# Patient Record
Sex: Male | Born: 1965 | Race: Black or African American | Hispanic: No | Smoking: Never smoker
Health system: Southern US, Community
[De-identification: ages and names within clinical notes are randomized; demographics above are authoritative.]

## PROBLEM LIST (undated history)

## (undated) DIAGNOSIS — K219 Gastro-esophageal reflux disease without esophagitis: Secondary | ICD-10-CM

## (undated) DIAGNOSIS — M549 Dorsalgia, unspecified: Secondary | ICD-10-CM

---

## 2013-03-21 ENCOUNTER — Encounter (HOSPITAL_COMMUNITY): Payer: Self-pay | Admitting: Emergency Medicine

## 2013-03-21 ENCOUNTER — Emergency Department (HOSPITAL_COMMUNITY)
Admission: EM | Admit: 2013-03-21 | Discharge: 2013-03-21 | Disposition: A | Discharge status: Home / Self Care | Payer: Managed Care, Other (non HMO) | Source: Home / Self Care

## 2013-03-21 DIAGNOSIS — S335XXA Sprain of ligaments of lumbar spine, initial encounter: Secondary | ICD-10-CM

## 2013-03-21 DIAGNOSIS — IMO0002 Reserved for concepts with insufficient information to code with codable children: Secondary | ICD-10-CM

## 2013-03-21 DIAGNOSIS — S39012A Strain of muscle, fascia and tendon of lower back, initial encounter: Secondary | ICD-10-CM

## 2013-03-21 DIAGNOSIS — M5410 Radiculopathy, site unspecified: Secondary | ICD-10-CM

## 2013-03-21 HISTORY — DX: Dorsalgia, unspecified: M54.9

## 2013-03-21 MED ORDER — CYCLOBENZAPRINE HCL 5 MG PO TABS: 5.0000 mg | ORAL_TABLET | Freq: Three times a day (TID) | ORAL | Status: DC | PRN

## 2013-03-21 MED ORDER — DICLOFENAC POTASSIUM 50 MG PO TABS: 50.0000 mg | ORAL_TABLET | Freq: Three times a day (TID) | ORAL | Status: DC

## 2013-03-21 MED ORDER — TRAMADOL HCL 50 MG PO TABS: 50.0000 mg | ORAL_TABLET | Freq: Four times a day (QID) | ORAL | Status: DC | PRN

## 2013-03-21 MED ORDER — KETOROLAC TROMETHAMINE 60 MG/2ML IM SOLN
60.0000 mg | Freq: Once | INTRAMUSCULAR | Status: AC
  Administered 2013-03-21: 60 mg via INTRAMUSCULAR

## 2013-03-21 MED ORDER — METHYLPREDNISOLONE 4 MG PO KIT: PACK | ORAL | Status: DC

## 2013-03-21 MED ORDER — KETOROLAC TROMETHAMINE 60 MG/2ML IM SOLN
INTRAMUSCULAR | Status: AC
  Filled 2013-03-21: qty 2

## 2013-03-21 NOTE — ED Notes (Signed)
Delay in giving

## 2013-03-21 NOTE — ED Notes (Signed)
Delay in executing orders ,  Secondary to acuity of nurse assignment

## 2013-03-21 NOTE — ED Notes (Signed)
Patient chart occupied-unable to remove from chart rack

## 2013-03-21 NOTE — ED Provider Notes (Signed)
Medical screening examination/treatment/procedure(s) were performed by non-physician practitioner and as supervising physician I was immediately available for consultation/collaboration.   MORENO-COLL,Jeanita Carneiro; MD  Dasani Crear Moreno-Coll, MD 03/21/13 1546 

## 2013-03-21 NOTE — ED Provider Notes (Signed)
History     CSN: [id number]  Arrival date & time 03/21/13  1040   None     Chief Complaint  Patient presents with  . Back Pain    (Consider location/radiation/quality/duration/timing/severity/associated sxs/prior treatment) HPI Comments: 47 year old man states that he has chronic intermittent back pain approximately once per year. Couple days ago he was lifting a dresser carrying it up stairs as well as getting in the car. As these actions pretty sudden severe pain in the right low back. He complains of persistent right low back pain with some radiation to the right buttock. Pain is greatly exacerbated with movement turning twisting flexion and extension at the waist. Denies focal paresthesias, or weakness or tingling.   Past Medical History  Diagnosis Date  . Back pain     History reviewed. No pertinent past surgical history.  No family history on file.  History  Substance Use Topics  . Smoking status: Never Smoker   . Smokeless tobacco: Not on file  . Alcohol Use: No      Review of Systems  Constitutional: Negative.   Respiratory: Negative.   Gastrointestinal: Negative.   Genitourinary: Negative.   Musculoskeletal:       As per HPI  Skin: Negative.   Neurological: Negative for dizziness, weakness, numbness and headaches.  Psychiatric/Behavioral: Negative.     Allergies  Review of patient's allergies indicates not on file.  Home Medications   Current Outpatient Rx  Name  Route  Sig  Dispense  Refill  . acetaminophen (TYLENOL) 325 MG tablet   Oral   Take 650 mg by mouth every 6 (six) hours as needed for pain.         Marland Kitchen ibuprofen (ADVIL,MOTRIN) 200 MG tablet   Oral   Take 200 mg by mouth every 6 (six) hours as needed for pain.         . cyclobenzaprine (FLEXERIL) 5 MG tablet   Oral   Take 1 tablet (5 mg total) by mouth 3 (three) times daily as needed for muscle spasms.   20 tablet   0   . methylPREDNISolone (MEDROL DOSEPAK) 4 MG tablet     follow package directions   21 tablet   0   . traMADol (ULTRAM) 50 MG tablet   Oral   Take 1 tablet (50 mg total) by mouth every 6 (six) hours as needed for pain.   20 tablet   0     BP 111/66  Pulse 63  Temp(Src) 98 F (36.7 C) (Oral)  Resp 16  SpO2 100%  Physical Exam  Vitals reviewed. Constitutional: He is oriented to person, place, and time. He appears well-developed and well-nourished.  HENT:  Head: Normocephalic and atraumatic.  Eyes: EOM are normal. Left eye exhibits no discharge.  Neck: Normal range of motion. Neck supple.  Musculoskeletal:   palpation reveals little to no pain to the right lower lumbar musculature. however movement exacerbates the pain during examination. There is no spinal tenderness to palpation or percussion. No deformity or step-off deformity. No tenderness to the buttock. Positive for straight leg raise. Distal neurovascular motor sensory is intact.   Neurological: He is alert and oriented to person, place, and time. No cranial nerve deficit.  Skin: Skin is warm and dry.  Psychiatric: He has a normal mood and affect.    ED Course  Procedures (including critical care time)  Labs Reviewed - No data to display No results found.   1. Lumbar strain, initial encounter  2. Radicular low back pain       MDM  Rest, heat, starched stretches is per instructions. Limit your work activity. May be out of work for the next 3 days. Medrol Dosepak Tramadol 50 mg Flexeril 5 mg, may cause drowsiness Toradol 60 mg IM  Hayden Rasmussen, NP 03/21/13 1229

## 2013-03-21 NOTE — ED Notes (Signed)
Patient needed work note, provided work note as instructed by [employer], np

## 2013-03-21 NOTE — ED Notes (Signed)
Reports recurrent back pain, same time of year .  Patient reports he helped move a dresser, but no pain till later.  Right lower back and pain into right buttocks.

## 2013-03-28 ENCOUNTER — Other Ambulatory Visit: Payer: Self-pay | Admitting: Orthopedic Surgery

## 2013-03-28 DIAGNOSIS — M545 Low back pain: Secondary | ICD-10-CM

## 2013-04-04 ENCOUNTER — Other Ambulatory Visit: Payer: Managed Care, Other (non HMO)

## 2013-04-28 ENCOUNTER — Emergency Department (HOSPITAL_COMMUNITY)
Admission: EM | Admit: 2013-04-28 | Discharge: 2013-04-28 | Disposition: A | Discharge status: Home / Self Care | Payer: Managed Care, Other (non HMO) | Source: Home / Self Care

## 2013-04-28 ENCOUNTER — Encounter (HOSPITAL_COMMUNITY): Payer: Self-pay | Admitting: *Deleted

## 2013-04-28 DIAGNOSIS — S335XXA Sprain of ligaments of lumbar spine, initial encounter: Secondary | ICD-10-CM

## 2013-04-28 DIAGNOSIS — J029 Acute pharyngitis, unspecified: Secondary | ICD-10-CM

## 2013-04-28 DIAGNOSIS — IMO0002 Reserved for concepts with insufficient information to code with codable children: Secondary | ICD-10-CM

## 2013-04-28 LAB — POCT RAPID STREP A: Streptococcus, Group A Screen (Direct): NEGATIVE

## 2013-04-28 MED ORDER — TRAMADOL HCL 50 MG PO TABS: 50.0000 mg | ORAL_TABLET | Freq: Four times a day (QID) | ORAL | Status: DC | PRN

## 2013-04-28 NOTE — ED Provider Notes (Signed)
History    CSN: [id number] Arrival date & time 04/28/13  4098  First MD Initiated Contact with Patient 04/28/13 1945     Chief Complaint  Patient presents with  . Sore Throat   (Consider location/radiation/quality/duration/timing/severity/associated sxs/prior Treatment) HPI Comments: 47 year old male awoke this morning with sore throat. He states it is worsened significantly throughout the day the pain radiates towards the right year. Denies fever, chills, chest pain or shortness of breath. He states the pain is severe.  Past Medical History  Diagnosis Date  . Back pain    History reviewed. No pertinent past surgical history. History reviewed. No pertinent family history. History  Substance Use Topics  . Smoking status: Never Smoker   . Smokeless tobacco: Not on file  . Alcohol Use: No    Review of Systems  Constitutional: Positive for activity change. Negative for fever, diaphoresis and fatigue.  HENT: Positive for sore throat and postnasal drip. Negative for ear pain, facial swelling, rhinorrhea, trouble swallowing, neck pain and neck stiffness.   Eyes: Negative for pain, discharge and redness.  Respiratory: Negative for cough, chest tightness, shortness of breath and wheezing.   Cardiovascular: Negative.   Gastrointestinal: Negative.   Musculoskeletal: Negative.   Skin: Negative for rash.  Neurological: Negative.     Allergies  Review of patient's allergies indicates no known allergies.  Home Medications   Current Outpatient Rx  Name  Route  Sig  Dispense  Refill  . acetaminophen (TYLENOL) 325 MG tablet   Oral   Take 650 mg by mouth every 6 (six) hours as needed for pain.         . cyclobenzaprine (FLEXERIL) 5 MG tablet   Oral   Take 1 tablet (5 mg total) by mouth 3 (three) times daily as needed for muscle spasms.   20 tablet   0   . ibuprofen (ADVIL,MOTRIN) 200 MG tablet   Oral   Take 200 mg by mouth every 6 (six) hours as needed for pain.          . methylPREDNISolone (MEDROL DOSEPAK) 4 MG tablet      follow package directions   21 tablet   0   . traMADol (ULTRAM) 50 MG tablet   Oral   Take 1 tablet (50 mg total) by mouth every 6 (six) hours as needed for pain.   20 tablet   0   . traMADol (ULTRAM) 50 MG tablet   Oral   Take 1 tablet (50 mg total) by mouth every 6 (six) hours as needed for pain.   15 tablet   0    BP 118/77  Pulse 70  Temp(Src) 98.3 F (36.8 C) (Oral)  Resp 20  SpO2 99% Physical Exam  Nursing note and vitals reviewed. Constitutional: He is oriented to person, place, and time. He appears well-developed and well-nourished. No distress.  HENT:  Right Ear: External ear normal.  Left Ear: External ear normal.  Mouth/Throat: No oropharyngeal exudate.  Mild erythema of the oropharynx. No swelling, abscess formation or tonsilar enlargement.. No exudates  Eyes: Conjunctivae and EOM are normal.  Neck: Normal range of motion. Neck supple.  Cardiovascular: Normal rate, regular rhythm and normal heart sounds.   Pulmonary/Chest: Effort normal and breath sounds normal. No respiratory distress. He has no wheezes. He has no rales.  Musculoskeletal: Normal range of motion. He exhibits no edema.  Lymphadenopathy:    He has no cervical adenopathy.  Neurological: He is alert and oriented to person,  place, and time.  Skin: Skin is warm and dry. No rash noted.  Psychiatric: He has a normal mood and affect.    ED Course  Procedures (including critical care time) Labs Reviewed  POCT RAPID STREP A ([facility] ONLY)   No results found. 1. Pharyngitis     MDM  50 mg every 4 hours when necessary pain and ibuprofen 600 mg Q6 hours when necessary pain Drink plenty of fluids he may need to stop fasting at this point A throat culture was obtained.   Hayden Rasmussen, NP 04/28/13 2108

## 2013-04-28 NOTE — ED Notes (Addendum)
C/o sore throat onset this morning.  Has some chills but is afebrile.  C/o L earache and headache.  States he is " on a fast."

## 2013-04-30 LAB — CULTURE, GROUP A STREP

## 2013-04-30 NOTE — ED Provider Notes (Signed)
Medical screening examination/treatment/procedure(s) were performed by resident physician or non-physician practitioner and as supervising physician I was immediately available for consultation/collaboration.   Barkley Bruns MD.   Linna Hoff, MD 04/30/13 [phone]

## 2014-03-11 ENCOUNTER — Emergency Department (HOSPITAL_COMMUNITY)
Admission: EM | Admit: 2014-03-11 | Discharge: 2014-03-11 | Disposition: A | Discharge status: Home / Self Care | Payer: Managed Care, Other (non HMO) | Attending: Emergency Medicine | Admitting: Emergency Medicine

## 2014-03-11 ENCOUNTER — Emergency Department (HOSPITAL_COMMUNITY): Payer: Managed Care, Other (non HMO)

## 2014-03-11 ENCOUNTER — Encounter (HOSPITAL_COMMUNITY): Payer: Self-pay | Admitting: Emergency Medicine

## 2014-03-11 ENCOUNTER — Ambulatory Visit: Payer: Managed Care, Other (non HMO)

## 2014-03-11 ENCOUNTER — Ambulatory Visit (INDEPENDENT_AMBULATORY_CARE_PROVIDER_SITE_OTHER): Payer: Managed Care, Other (non HMO) | Admitting: Emergency Medicine

## 2014-03-11 ENCOUNTER — Ambulatory Visit (HOSPITAL_COMMUNITY): Admission: RE | Admit: 2014-03-11 | Payer: Managed Care, Other (non HMO) | Source: Ambulatory Visit

## 2014-03-11 VITALS — BP 112/66 | HR 68 | Temp 97.7°F | Resp 16 | Ht 72.0 in | Wt 157.5 lb

## 2014-03-11 DIAGNOSIS — R0781 Pleurodynia: Secondary | ICD-10-CM

## 2014-03-11 DIAGNOSIS — R071 Chest pain on breathing: Secondary | ICD-10-CM

## 2014-03-11 DIAGNOSIS — R509 Fever, unspecified: Secondary | ICD-10-CM | POA: Insufficient documentation

## 2014-03-11 DIAGNOSIS — R0789 Other chest pain: Secondary | ICD-10-CM

## 2014-03-11 DIAGNOSIS — Z8719 Personal history of other diseases of the digestive system: Secondary | ICD-10-CM | POA: Insufficient documentation

## 2014-03-11 DIAGNOSIS — Z791 Long term (current) use of non-steroidal anti-inflammatories (NSAID): Secondary | ICD-10-CM | POA: Insufficient documentation

## 2014-03-11 HISTORY — DX: Gastro-esophageal reflux disease without esophagitis: K21.9

## 2014-03-11 LAB — BASIC METABOLIC PANEL
BUN: 14 mg/dL (ref 6–23)
CALCIUM: 9.4 mg/dL (ref 8.4–10.5)
CHLORIDE: 102 meq/L (ref 96–112)
CO2: 25 mEq/L (ref 19–32)
CREATININE: 0.96 mg/dL (ref 0.50–1.35)
GFR calc non Af Amer: >90 mL/min (ref >90)
Glucose, Bld: 85 mg/dL (ref 70–99)
Potassium: 4.3 mEq/L (ref 3.7–5.3)
Sodium: 140 mEq/L (ref 137–147)

## 2014-03-11 LAB — CBC
HEMATOCRIT: 41.3 % (ref 39.0–52.0)
Hemoglobin: 14.3 g/dL (ref 13.0–17.0)
MCH: 30.6 pg (ref 26.0–34.0)
MCHC: 34.6 g/dL (ref 30.0–36.0)
MCV: 88.4 fL (ref 78.0–100.0)
Platelets: 217 10*3/uL (ref 150–400)
RBC: 4.67 MIL/uL (ref 4.22–5.81)
RDW: 12.4 % (ref 11.5–15.5)
WBC: 5 10*3/uL (ref 4.0–10.5)

## 2014-03-11 LAB — POCT CBC
Granulocyte percent: 46.7 %G (ref 37–80)
HCT, POC: 45.1 % (ref 43.5–53.7)
Hemoglobin: 14.7 g/dL (ref 14.1–18.1)
LYMPH, POC: 2 (ref 0.6–3.4)
MCH: 30.4 pg (ref 27–31.2)
MCHC: 32.6 g/dL (ref 31.8–35.4)
MCV: 93.1 fL (ref 80–97)
MID (CBC): 0.5 (ref 0–0.9)
MPV: 11.2 fL (ref 0–99.8)
PLATELET COUNT, POC: 223 10*3/uL (ref 142–424)
POC Granulocyte: 2.2 (ref 2–6.9)
POC LYMPH %: 42.5 % (ref 10–50)
POC MID %: 10.8 % (ref 0–12)
RBC: 4.84 M/uL (ref 4.69–6.13)
RDW, POC: 13.2 %
WBC: 4.8 10*3/uL (ref 4.6–10.2)

## 2014-03-11 LAB — TROPONIN I: Troponin I: <0.3 ng/mL (ref <0.30)

## 2014-03-11 LAB — D-DIMER, QUANTITATIVE
D-Dimer, Quant: 1.11 ug/mL-FEU — ABNORMAL HIGH (ref 0.00–0.48)
D-Dimer, Quant: 1.09 ug/mL-FEU — ABNORMAL HIGH (ref 0.00–0.48)

## 2014-03-11 LAB — POCT SEDIMENTATION RATE: POCT SED RATE: 42 mm/hr — AB (ref 0–22)

## 2014-03-11 MED ORDER — SODIUM CHLORIDE 0.9 % IV BOLUS (SEPSIS)
1000.0000 mL | Freq: Once | INTRAVENOUS | Status: AC
  Administered 2014-03-11: 1000 mL via INTRAVENOUS

## 2014-03-11 MED ORDER — TRAMADOL HCL 50 MG PO TABS: 50.0000 mg | ORAL_TABLET | Freq: Three times a day (TID) | ORAL | Status: DC | PRN

## 2014-03-11 MED ORDER — ACETAMINOPHEN 500 MG PO TABS
1000.0000 mg | ORAL_TABLET | Freq: Once | ORAL | Status: AC
  Administered 2014-03-11: 1000 mg via ORAL
  Filled 2014-03-11: qty 2

## 2014-03-11 MED ORDER — IOHEXOL 350 MG/ML SOLN
100.0000 mL | Freq: Once | INTRAVENOUS | Status: AC | PRN
  Administered 2014-03-11: 100 mL via INTRAVENOUS

## 2014-03-11 MED ORDER — MELOXICAM 7.5 MG PO TABS: 7.5000 mg | ORAL_TABLET | Freq: Every day | ORAL | Status: DC

## 2014-03-11 NOTE — Patient Instructions (Addendum)
YOU ARE TO GO TO [facility] AND MAKE SURE YOU TELL THEM THAT YOU ARE THERE AS OUTPATIENT AND THERE FOR A CT      Chest Pain (Nonspecific) It is often hard to give a specific diagnosis for the cause of chest pain. There is always a chance that your pain could be related to something serious, such as a heart attack or a blood clot in the lungs. You need to follow up with your caregiver for further evaluation. CAUSES   Heartburn.  Pneumonia or bronchitis.  Anxiety or stress.  Inflammation around your heart (pericarditis) or lung (pleuritis or pleurisy).  A blood clot in the lung.  A collapsed lung (pneumothorax). It can develop suddenly on its own (spontaneous pneumothorax) or from injury (trauma) to the chest.  Shingles infection (herpes zoster virus). The chest wall is composed of bones, muscles, and cartilage. Any of these can be the source of the pain.  The bones can be bruised by injury.  The muscles or cartilage can be strained by coughing or overwork.  The cartilage can be affected by inflammation and become sore (costochondritis). DIAGNOSIS  Lab tests or other studies, such as X-rays, electrocardiography, stress testing, or cardiac imaging, may be needed to find the cause of your pain.  TREATMENT   Treatment depends on what may be causing your chest pain. Treatment may include:  Acid blockers for heartburn.  Anti-inflammatory medicine.  Pain medicine for inflammatory conditions.  Antibiotics if an infection is present.  You may be advised to change lifestyle habits. This includes stopping smoking and avoiding alcohol, caffeine, and chocolate.  You may be advised to keep your head raised (elevated) when sleeping. This reduces the chance of acid going backward from your stomach into your esophagus.  Most of the time, nonspecific chest pain will improve within 2 to 3 days with rest and mild pain medicine. HOME CARE INSTRUCTIONS   If antibiotics were  prescribed, take your antibiotics as directed. Finish them even if you start to feel better.  For the next few days, avoid physical activities that bring on chest pain. Continue physical activities as directed.  Do not smoke.  Avoid drinking alcohol.  Only take over-the-counter or prescription medicine for pain, discomfort, or fever as directed by your caregiver.  Follow your caregiver's suggestions for further testing if your chest pain does not go away.  Keep any follow-up appointments you made. If you do not go to an appointment, you could develop lasting (chronic) problems with pain. If there is any problem keeping an appointment, you must call to reschedule. SEEK MEDICAL CARE IF:   You think you are having problems from the medicine you are taking. Read your medicine instructions carefully.  Your chest pain does not go away, even after treatment.  You develop a rash with blisters on your chest. SEEK IMMEDIATE MEDICAL CARE IF:   You have increased chest pain or pain that spreads to your arm, neck, jaw, back, or abdomen.  You develop shortness of breath, an increasing cough, or you are coughing up blood.  You have severe back or abdominal pain, feel nauseous, or vomit.  You develop severe weakness, fainting, or chills.  You have a fever. THIS IS AN EMERGENCY. Do not wait to see if the pain will go away. Get medical help at once. Call your local emergency services (911 in [place] Do not drive yourself to the hospital. MAKE SURE YOU:   Understand these instructions.  Will watch your  condition.  Will get help right away if you are not doing well or get worse. Document Released: 07/09/2005 Document Revised: 12/22/2011 Document Reviewed: 05/04/2008 [facility] Patient Information 2014 [place].

## 2014-03-11 NOTE — ED Notes (Signed)
Pt reports left ribcage pain with deep breath since Monday or Tuesday. Pt denies CP or SOB.

## 2014-03-11 NOTE — ED Provider Notes (Signed)
CSN: [id number]     Arrival date & time 03/11/14  1435 History   First MD Initiated Contact with Patient 03/11/14 1503     Chief Complaint  Patient presents with  . CT Angio   . Shortness of Breath     (Consider location/radiation/quality/duration/timing/severity/associated sxs/prior Treatment) HPI 48 year old male presents with 5 days of waxing and waning left chest pain. He points to his anterior left chest as focal point of pain. The pain is worse with inspiration. Felt subjectively febrile but not currently. No cough. No dyspnea. No nausea or exertional symptoms. States the pain was worse last night, rated as severe, but now is about 1-2/10. Unable to describe exactly what it feels like. Denies leg swelling or prior DVT. 3 weeks ago traveled to [place] on a bus but never developed symptoms of a DVT. Went to urgent care, was given Rx for pain and sent to ED for CT angio to rule out PE.  Past Medical History  Diagnosis Date  . Back pain   . GERD (gastroesophageal reflux disease)    History reviewed. No pertinent past surgical history. No family history on file. History  Substance Use Topics  . Smoking status: Never Smoker   . Smokeless tobacco: Never Used  . Alcohol Use: No    Review of Systems  Constitutional: Positive for fever (felt subjective fevers).  Respiratory: Negative for cough and shortness of breath.   Cardiovascular: Positive for chest pain. Negative for leg swelling.  Gastrointestinal: Negative for vomiting and abdominal pain.  All other systems reviewed and are negative.     Allergies  Review of patient's allergies indicates no known allergies.  Home Medications   Prior to Admission medications   Medication Sig Start Date End Date Taking? Authorizing Provider  acetaminophen (TYLENOL) 325 MG tablet Take 650 mg by mouth every 6 (six) hours as needed for pain.   Yes Historical Provider, MD  ibuprofen (ADVIL,MOTRIN) 200 MG tablet Take 200 mg by mouth every  6 (six) hours as needed for pain.   Yes Historical Provider, MD  meloxicam (MOBIC) 7.5 MG tablet Take 1 tablet (7.5 mg total) by mouth daily. 03/11/14   Darlyne Russian, MD  traMADol (ULTRAM) 50 MG tablet Take 1 tablet (50 mg total) by mouth every 8 (eight) hours as needed. 03/11/14   Darlyne Russian, MD   BP 116/71  Pulse 65  Temp(Src) 97.8 F (36.6 C) (Oral)  Resp 16  SpO2 100% Physical Exam  Nursing note and vitals reviewed. Constitutional: He is oriented to person, place, and time. He appears well-developed and well-nourished. No distress.  HENT:  Head: Normocephalic and atraumatic.  Right Ear: External ear normal.  Left Ear: External ear normal.  Nose: Nose normal.  Eyes: Right eye exhibits no discharge. Left eye exhibits no discharge.  Neck: Neck supple.  Cardiovascular: Normal rate, regular rhythm, normal heart sounds and intact distal pulses.   Pulmonary/Chest: Effort normal. He exhibits tenderness (mild point tenderness to lower left anterior chest).  Abdominal: Soft. He exhibits no distension. There is no tenderness.  Musculoskeletal: He exhibits no edema.  Neurological: He is alert and oriented to person, place, and time.  Skin: Skin is warm and dry.    ED Course  Procedures (including critical care time) Labs Review Labs Reviewed  D-DIMER, QUANTITATIVE - Abnormal; Notable for the following:    D-Dimer, Quant 1.09 (*)    All other components within normal limits  CBC  BASIC METABOLIC  PANEL  TROPONIN I    Imaging Review Dg Chest 2 View  03/11/2014   CLINICAL DATA:  Pleuritic chest pain  EXAM: CHEST  2 VIEW  COMPARISON:  None.  FINDINGS: The heart size and mediastinal contours are within normal limits. Both lungs are clear. The visualized skeletal structures are unremarkable.  IMPRESSION: No active cardiopulmonary disease.   Electronically Signed   By: Skipper Cliche M.D.   On: 03/11/2014 13:18   Ct Angio Chest W/cm &/or Wo Cm  03/11/2014   CLINICAL DATA:  Elevated  D-dimer, left-sided chest pain with deep inspiration for 4 days  EXAM: CT ANGIOGRAPHY CHEST WITH CONTRAST  TECHNIQUE: Multidetector CT imaging of the chest was performed using the standard protocol during bolus administration of intravenous contrast. Multiplanar CT image reconstructions and MIPs were obtained to evaluate the vascular anatomy.  CONTRAST:  164mL OMNIPAQUE IOHEXOL 350 MG/ML SOLN  COMPARISON:  03/11/2014 chest x-ray  FINDINGS: Thoracic inlet is normal. No pleural or pericardial effusion. No hilar or mediastinal adenopathy. No pneumothorax.  No filling defects in the pulmonary arterial system. Thoracic aorta shows no dissection or dilatation.  Lungs are clear. No acute musculoskeletal abnormalities. Scans through the upper abdomen are negative.  Review of the MIP images confirms the above findings.  IMPRESSION: Normal study   Electronically Signed   By: Skipper Cliche M.D.   On: 03/11/2014 17:17     EKG Interpretation   Date/Time:  Saturday Mar 11 2014 15:28:39 EDT Ventricular Rate:  60 PR Interval:  177 QRS Duration: 95 QT Interval:  391 QTC Calculation: 391 R Axis:   55 Text Interpretation:  Sinus rhythm Normal ECG No old tracing to compare  Confirmed by [place] ([id number]) on 03/11/2014 3:46:31 PM      MDM   Final diagnoses:  Chest wall pain    Patient's pain is not consistent with ACS. Normal EKG and troponin after several days of symptoms. Likely chest wall pain. He had a long bus trip several weeks ago but never developed clinical signs of DVT. Thus I feel he is low risk for PE. Ddimer elevated, CT shows no acute disease, including no PE. The most likely etiology is chest wall pain. He is stable for discharge at this time, and was given mobic and ultram by the urgent care. Will give return precautions.    Ephraim Hamburger, MD 03/11/14 [phone]

## 2014-03-11 NOTE — Progress Notes (Signed)
   Subjective:    Patient ID: Michael Ryan, male    DOB: 05/29/1966, 48 y.o.   MRN: [id number]  HPI Michael Ryan is here complaining of left sided flank/chest pain. Pt states this has been bothering him for the past 4-5 days. Pt describes the pain as worse at night, it is still bothersome during the day, but not as severe as it is at night. Worse over the last 2 nights, he states he is not able to sleep at night. He describes this pain as worse with inspiration. He does note some pain with movement. He also describes sensitivity when touching the affected area.  He has been taking Advil with little to no relief. He denies SOB. Pt denies history of smoking. He is a Glass blower/designer, and has been in the same position for 13 years. He denies any known injury. Pt denies pain in legs. Pt states he did have a fever when he first noticed this pain. Pt states he did take a trip to [place] by car 3 weeks ago, he does note sitting in the car for a 12 hour stretch.  He also complains of headache    Review of Systems  Respiratory: Negative for shortness of breath.   Genitourinary: Positive for flank pain.  Neurological: Positive for headaches.       Objective:   Physical Exam patient is alert and cooperative he is not in any distress. His neck is supple. Chest exam with the patient supine reveals a loud left anterior chest rub. Cardiac exam is a regular rate without murmurs rubs or gallops abdomen soft nontender  [facility] reading (PRIMARY) by  Dr.Tiffay Pinette no acute disease seen on x-ray specifically I did not see any sign of a pneumothorax.  Results for orders placed in visit on 03/11/14  POCT CBC      Result Value Ref Range   WBC 4.8  4.6 - 10.2 K/uL   Lymph, poc 2.0  0.6 - 3.4   POC LYMPH PERCENT 42.5  10 - 50 %L   MID (cbc) 0.5  0 - 0.9   POC MID % 10.8  0 - 12 %M   POC Granulocyte 2.2  2 - 6.9   Granulocyte percent 46.7  37 - 80 %G   RBC 4.84  4.69 - 6.13 M/uL   Hemoglobin 14.7  14.1 - 18.1 g/dL   HCT, POC  45.1  43.5 - 53.7 %   MCV 93.1  80 - 97 fL   MCH, POC 30.4  27 - 31.2 pg   MCHC 32.6  31.8 - 35.4 g/dL   RDW, POC 13.2     Platelet Count, POC 223  142 - 424 K/uL   MPV 11.2  0 - 99.8 fL      Assessment & Plan:  CBC and chest x-ray are normal. He still has had 2 nights of significant pain and a history of prolonged car ride 3 weeks ago. I am still concerned about clot even though he is not tachycardic and not hypoxic. I did initially hear a rub on exam but could not hear it again on repeat examination. I do feel a CT angiogram is indicated . Patient was eventually seen in the ER. His d-dimer was elevated however CT angiogram was normal. He was treated as chest wall pain .

## 2014-03-11 NOTE — ED Notes (Signed)
Pt sent by Dr. Everlene Farrier, Urgent [facility], for CT Angio as outpt. Seen at PCP for pain when taking a deep breath. Denies shob.

## 2014-03-11 NOTE — Discharge Instructions (Signed)
Chest Pain (Nonspecific) Chest pain has many causes. Your pain could be caused by something serious, such as a heart attack or a blood clot in the lungs. It could also be caused by something less serious, such as a chest bruise or a virus. Follow up with your doctor. More lab tests or other studies may be needed to find the cause of your pain. Most of the time, nonspecific chest pain will improve within 2 to 3 days of rest and mild pain medicine. HOME CARE  For chest bruises, you may put ice on the sore area for 15-20 minutes, 03-04 times a day. Do this only if it makes you feel better.  Put ice in a plastic bag.  Place a towel between the skin and the bag.  Rest for the next 2 to 3 days.  Go back to work if the pain improves.  See your doctor if the pain lasts longer than 1 to 2 weeks.  Only take medicine as told by your doctor.  Quit smoking if you smoke. GET HELP RIGHT AWAY IF:   There is more pain or pain that spreads to the arm, neck, jaw, back, or belly (abdomen).  You have shortness of breath.  You cough more than usual or cough up blood.  You have very bad back or belly pain, feel sick to your stomach (nauseous), or throw up (vomit).  You have very bad weakness.  You pass out (faint).  You have a fever. Any of these problems may be serious and may be an emergency. Do not wait to see if the problems will go away. Get medical help right away. Call your local emergency services 911 in [place] Do not drive yourself to the hospital. MAKE SURE YOU:   Understand these instructions.  Will watch this condition.  Will get help right away if you or your child is not doing well or gets worse. Document Released: 03/17/2008 Document Revised: 12/22/2011 Document Reviewed: 03/17/2008 [facility] Patient Information 2014 [place], [place].    Chest Wall Pain Chest wall pain is pain in or around the bones and muscles of your chest. It may take up to 6 weeks to get better. It may take  longer if you must stay physically active in your work and activities.  CAUSES  Chest wall pain may happen on its own. However, it may be caused by:  A viral illness like the flu.  Injury.  Coughing.  Exercise.  Arthritis.  Fibromyalgia.  Shingles. HOME CARE INSTRUCTIONS   Avoid overtiring physical activity. Try not to strain or perform activities that cause pain. This includes any activities using your chest or your abdominal and side muscles, especially if heavy weights are used.  Put ice on the sore area.  Put ice in a plastic bag.  Place a towel between your skin and the bag.  Leave the ice on for 15-20 minutes per hour while awake for the first 2 days.  Only take over-the-counter or prescription medicines for pain, discomfort, or fever as directed by your caregiver. SEEK IMMEDIATE MEDICAL CARE IF:   Your pain increases, or you are very uncomfortable.  You have a fever.  Your chest pain becomes worse.  You have new, unexplained symptoms.  You have nausea or vomiting.  You feel sweaty or lightheaded.  You have a cough with phlegm (sputum), or you cough up blood. MAKE SURE YOU:   Understand these instructions.  Will watch your condition.  Will get help right away if  you are not doing well or get worse. Document Released: 09/29/2005 Document Revised: 12/22/2011 Document Reviewed: 05/26/2011 [facility] Patient Information 2014 [place], [place].

## 2015-01-03 ENCOUNTER — Ambulatory Visit (INDEPENDENT_AMBULATORY_CARE_PROVIDER_SITE_OTHER): Payer: Managed Care, Other (non HMO)

## 2015-01-03 ENCOUNTER — Ambulatory Visit (INDEPENDENT_AMBULATORY_CARE_PROVIDER_SITE_OTHER): Payer: Managed Care, Other (non HMO) | Admitting: Emergency Medicine

## 2015-01-03 VITALS — BP 124/74 | HR 79 | Temp 98.0°F | Resp 17 | Ht 70.5 in | Wt 162.0 lb

## 2015-01-03 DIAGNOSIS — M25511 Pain in right shoulder: Secondary | ICD-10-CM | POA: Diagnosis not present

## 2015-01-03 NOTE — Progress Notes (Signed)
Subjective:    Patient ID: Michael Ryan, male    DOB: 1966-02-27, 49 y.o.   MRN: [id number]  Chief Complaint  Patient presents with  . Shoulder Pain   There are no active problems to display for this patient.  Prior to Admission medications   Medication Sig Start Date End Date Taking? Authorizing Provider  acetaminophen (TYLENOL) 325 MG tablet Take 650 mg by mouth every 6 (six) hours as needed for pain.   Yes Historical Provider, MD  ibuprofen (ADVIL,MOTRIN) 200 MG tablet Take 200 mg by mouth every 6 (six) hours as needed for pain.   Yes Historical Provider, MD   Medications, allergies, past medical history, surgical history, family history, social history and problem list reviewed and updated.  HPI  26 yom with no significant pmh presents with right shoulder pain.  Pain started on 12/26/14. He works on the [employer] at [employer] in front of him. Has done this for ten yrs. Has had mild intermittent right shoulder pain for several months which worsened on 3/15. Felt severe right top of shoulder pain while working. Mentioned to his supervisor that couldn't lift arm overhead who sent him to [facility] for workers comp. They worked him up for muscular strain --> started flexeril, nsaids prn, PT referral, and gave right shoulder precautions for work. No xray done. Went back on 3/21 --> continue PT, tylenol, pred taper. Continued work precautions.   When he went for his 2nd PT appt this am he was told that his injury was not a workers comp claim and that he would need to use his own insurance for further w/u, PT.   Pain continues to today. Has improved a bit. No pain when at rest. Continues to get top of shoulder pain with overhead movement. Taking flexeril nightly. Day 3 of his pred taper. Doing exercises at home which PT gave him. Applying ice and heat as needed.   Review of Systems No cp, sob, fever, chills.     Objective:   Physical Exam  Constitutional: He is  oriented to person, place, and time. He appears well-developed and well-nourished.  Non-toxic appearance. He does not have a sickly appearance. He does not appear ill. No distress.  BP 124/74 mmHg  Pulse 79  Temp(Src) 98 F (36.7 C) (Oral)  Resp 17  Ht 5' 10.5" (1.791 m)  Wt 162 lb (73.483 kg)  BMI 22.91 kg/m2  SpO2 97%   Musculoskeletal:       Right shoulder: He exhibits decreased strength. He exhibits normal range of motion, no tenderness, no bony tenderness, no swelling, no effusion, no crepitus, no pain and no spasm.  Mild decreased strength with shoulder flexion due to pain. No ttp. Top of shoulder pain with Neers test. Able to raise arm overhead completely with minimal pain. Normal rom. Normal resisted external rotation. No bicipital groove pain.   Neurological: He is alert and oriented to person, place, and time.  Psychiatric: He has a normal mood and affect. His speech is normal and behavior is normal.   [facility] reading (PRIMARY) by  Dr. Everlene Farrier. Findings: Mild AC joint arthritis. Otherwise normal.      Assessment & Plan:   23 yom with no significant pmh presents with right shoulder pain.  Right shoulder pain - Plan: DG Shoulder Right --likely mild rotator cuff tendonitis/strain as previously diagnosed one wk ago  --referral to ortho today, likely PT order from them --continue home exercises, flexeril, pred taper, ibuprofen prn --  per pt needs return to work without limitation prior to work allowing him to return --will defer to ortho for this   Julieta Gutting, PA-C Physician Assistant-Certified [facility]  01/03/2015 1:08 PM

## 2015-01-03 NOTE — Patient Instructions (Signed)
Your xray showed mild osteoarthritis.  We have referred you to orthopedics in order to have them assess the shoulder, order physical therapy, and best determine when you can return to work without restrictions.  They will be in contact with you.   Impingement Syndrome, Rotator Cuff, Bursitis with Rehab Impingement syndrome is a condition that involves inflammation of the tendons of the rotator cuff and the subacromial bursa, that causes pain in the shoulder. The rotator cuff consists of four tendons and muscles that control much of the shoulder and upper arm function. The subacromial bursa is a fluid filled sac that helps reduce friction between the rotator cuff and one of the bones of the shoulder (acromion). Impingement syndrome is usually an overuse injury that causes swelling of the bursa (bursitis), swelling of the tendon (tendonitis), and/or a tear of the tendon (strain). Strains are classified into three categories. Grade 1 strains cause pain, but the tendon is not lengthened. Grade 2 strains include a lengthened ligament, due to the ligament being stretched or partially ruptured. With grade 2 strains there is still function, although the function may be decreased. Grade 3 strains include a complete tear of the tendon or muscle, and function is usually impaired. SYMPTOMS   Pain around the shoulder, often at the outer portion of the upper arm.  Pain that gets worse with shoulder function, especially when reaching overhead or lifting.  Sometimes, aching when not using the arm.  Pain that wakes you up at night.  Sometimes, tenderness, swelling, warmth, or redness over the affected area.  Loss of strength.  Limited motion of the shoulder, especially reaching behind the back (to the back pocket or to unhook bra) or across your body.  Crackling sound (crepitation) when moving the arm.  Biceps tendon pain and inflammation (in the front of the shoulder). Worse when bending the elbow or  lifting. CAUSES  Impingement syndrome is often an overuse injury, in which chronic (repetitive) motions cause the tendons or bursa to become inflamed. A strain occurs when a force is paced on the tendon or muscle that is greater than it can withstand. Common mechanisms of injury include: Stress from sudden increase in duration, frequency, or intensity of training.  Direct hit (trauma) to the shoulder.  Aging, erosion of the tendon with normal use.  Bony bump on shoulder (acromial spur). RISK INCREASES WITH:  Contact sports (football, wrestling, boxing).  Throwing sports (baseball, tennis, volleyball).  Weightlifting and bodybuilding.  Heavy labor.  Previous injury to the rotator cuff, including impingement.  Poor shoulder strength and flexibility.  Failure to warm up properly before activity.  Inadequate protective equipment.  Old age.  Bony bump on shoulder (acromial spur). PREVENTION   Warm up and stretch properly before activity.  Allow for adequate recovery between workouts.  Maintain physical fitness:  Strength, flexibility, and endurance.  Cardiovascular fitness.  Learn and use proper exercise technique. PROGNOSIS  If treated properly, impingement syndrome usually goes away within 6 weeks. Sometimes surgery is required.  RELATED COMPLICATIONS   Longer healing time if not properly treated, or if not given enough time to heal.  Recurring symptoms, that result in a chronic condition.  Shoulder stiffness, frozen shoulder, or loss of motion.  Rotator cuff tendon tear.  Recurring symptoms, especially if activity is resumed too soon, with overuse, with a direct blow, or when using poor technique. TREATMENT  Treatment first involves the use of ice and medicine, to reduce pain and inflammation. The use of strengthening and  stretching exercises may help reduce pain with activity. These exercises may be performed at home or with a therapist. If non-surgical  treatment is unsuccessful after more than 6 months, surgery may be advised. After surgery and rehabilitation, activity is usually possible in 3 months.  MEDICATION  If pain medicine is needed, nonsteroidal anti-inflammatory medicines (aspirin and ibuprofen), or other minor pain relievers (acetaminophen), are often advised.  Do not take pain medicine for 7 days before surgery.  Prescription pain relievers may be given, if your caregiver thinks they are needed. Use only as directed and only as much as you need.  Corticosteroid injections may be given by your caregiver. These injections should be reserved for the most serious cases, because they may only be given a certain number of times. HEAT AND COLD  Cold treatment (icing) should be applied for 10 to 15 minutes every 2 to 3 hours for inflammation and pain, and immediately after activity that aggravates your symptoms. Use ice packs or an ice massage.  Heat treatment may be used before performing stretching and strengthening activities prescribed by your caregiver, physical therapist, or athletic trainer. Use a heat pack or a warm water soak. SEEK MEDICAL CARE IF:   Symptoms get worse or do not improve in 4 to 6 weeks, despite treatment.  New, unexplained symptoms develop. (Drugs used in treatment may produce side effects.) EXERCISES  RANGE OF MOTION (ROM) AND STRETCHING EXERCISES - Impingement Syndrome (Rotator Cuff  Tendinitis, Bursitis) These exercises may help you when beginning to rehabilitate your injury. Your symptoms may go away with or without further involvement from your physician, physical therapist or athletic trainer. While completing these exercises, remember:   Restoring tissue flexibility helps normal motion to return to the joints. This allows healthier, less painful movement and activity.  An effective stretch should be held for at least 30 seconds.  A stretch should never be painful. You should only feel a gentle  lengthening or release in the stretched tissue. STRETCH - Flexion, Standing  Stand with good posture. With an underhand grip on your right / left hand, and an overhand grip on the opposite hand, grasp a broomstick or cane so that your hands are a little more than shoulder width apart.  Keeping your right / left elbow straight and shoulder muscles relaxed, push the stick with your opposite hand, to raise your right / left arm in front of your body and then overhead. Raise your arm until you feel a stretch in your right / left shoulder, but before you have increased shoulder pain.  Try to avoid shrugging your right / left shoulder as your arm rises, by keeping your shoulder blade tucked down and toward your mid-back spine. Hold for __________ seconds.  Slowly return to the starting position. Repeat __________ times. Complete this exercise __________ times per day. STRETCH - Abduction, Supine  Lie on your back. With an underhand grip on your right / left hand and an overhand grip on the opposite hand, grasp a broomstick or cane so that your hands are a little more than shoulder width apart.  Keeping your right / left elbow straight and your shoulder muscles relaxed, push the stick with your opposite hand, to raise your right / left arm out to the side of your body and then overhead. Raise your arm until you feel a stretch in your right / left shoulder, but before you have increased shoulder pain.  Try to avoid shrugging your right / left shoulder as  your arm rises, by keeping your shoulder blade tucked down and toward your mid-back spine. Hold for __________ seconds.  Slowly return to the starting position. Repeat __________ times. Complete this exercise __________ times per day. ROM - Flexion, Active-Assisted  Lie on your back. You may bend your knees for comfort.  Grasp a broomstick or cane so your hands are about shoulder width apart. Your right / left hand should grip the end of the stick,  so that your hand is positioned "thumbs-up," as if you were about to shake hands.  Using your healthy arm to lead, raise your right / left arm overhead, until you feel a gentle stretch in your shoulder. Hold for __________ seconds.  Use the stick to assist in returning your right / left arm to its starting position. Repeat __________ times. Complete this exercise __________ times per day.  ROM - Internal Rotation, Supine   Lie on your back on a firm surface. Place your right / left elbow about 60 degrees away from your side. Elevate your elbow with a folded towel, so that the elbow and shoulder are the same height.  Using a broomstick or cane and your strong arm, pull your right / left hand toward your body until you feel a gentle stretch, but no increase in your shoulder pain. Keep your shoulder and elbow in place throughout the exercise.  Hold for __________ seconds. Slowly return to the starting position. Repeat __________ times. Complete this exercise __________ times per day. STRETCH - Internal Rotation  Place your right / left hand behind your back, palm up.  Throw a towel or belt over your opposite shoulder. Grasp the towel with your right / left hand.  While keeping an upright posture, gently pull up on the towel, until you feel a stretch in the front of your right / left shoulder.  Avoid shrugging your right / left shoulder as your arm rises, by keeping your shoulder blade tucked down and toward your mid-back spine.  Hold for __________ seconds. Release the stretch, by lowering your healthy hand. Repeat __________ times. Complete this exercise __________ times per day. ROM - Internal Rotation   Using an underhand grip, grasp a stick behind your back with both hands.  While standing upright with good posture, slide the stick up your back until you feel a mild stretch in the front of your shoulder.  Hold for __________ seconds. Slowly return to your starting position. Repeat  __________ times. Complete this exercise __________ times per day.  STRETCH - Posterior Shoulder Capsule   Stand or sit with good posture. Grasp your right / left elbow and draw it across your chest, keeping it at the same height as your shoulder.  Pull your elbow, so your upper arm comes in closer to your chest. Pull until you feel a gentle stretch in the back of your shoulder.  Hold for __________ seconds. Repeat __________ times. Complete this exercise __________ times per day. STRENGTHENING EXERCISES - Impingement Syndrome (Rotator Cuff Tendinitis, Bursitis) These exercises may help you when beginning to rehabilitate your injury. They may resolve your symptoms with or without further involvement from your physician, physical therapist or athletic trainer. While completing these exercises, remember:  Muscles can gain both the endurance and the strength needed for everyday activities through controlled exercises.  Complete these exercises as instructed by your physician, physical therapist or athletic trainer. Increase the resistance and repetitions only as guided.  You may experience muscle soreness or fatigue, but the  pain or discomfort you are trying to eliminate should never worsen during these exercises. If this pain does get worse, stop and make sure you are following the directions exactly. If the pain is still present after adjustments, discontinue the exercise until you can discuss the trouble with your clinician.  During your recovery, avoid activity or exercises which involve actions that place your injured hand or elbow above your head or behind your back or head. These positions stress the tissues which you are trying to heal. STRENGTH - Scapular Depression and Adduction   With good posture, sit on a firm chair. Support your arms in front of you, with pillows, arm rests, or on a table top. Have your elbows in line with the sides of your body.  Gently draw your shoulder blades  down and toward your mid-back spine. Gradually increase the tension, without tensing the muscles along the top of your shoulders and the back of your neck.  Hold for __________ seconds. Slowly release the tension and relax your muscles completely before starting the next repetition.  After you have practiced this exercise, remove the arm support and complete the exercise in standing as well as sitting position. Repeat __________ times. Complete this exercise __________ times per day.  STRENGTH - Shoulder Abductors, Isometric  With good posture, stand or sit about 4-6 inches from a wall, with your right / left side facing the wall.  Bend your right / left elbow. Gently press your right / left elbow into the wall. Increase the pressure gradually, until you are pressing as hard as you can, without shrugging your shoulder or increasing any shoulder discomfort.  Hold for __________ seconds.  Release the tension slowly. Relax your shoulder muscles completely before you begin the next repetition. Repeat __________ times. Complete this exercise __________ times per day.  STRENGTH - External Rotators, Isometric  Keep your right / left elbow at your side and bend it 90 degrees.  Step into a door frame so that the outside of your right / left wrist can press against the door frame without your upper arm leaving your side.  Gently press your right / left wrist into the door frame, as if you were trying to swing the back of your hand away from your stomach. Gradually increase the tension, until you are pressing as hard as you can, without shrugging your shoulder or increasing any shoulder discomfort.  Hold for __________ seconds.  Release the tension slowly. Relax your shoulder muscles completely before you begin the next repetition. Repeat __________ times. Complete this exercise __________ times per day.  STRENGTH - Supraspinatus   Stand or sit with good posture. Grasp a __________ weight, or an  exercise band or tubing, so that your hand is "thumbs-up," like you are shaking hands.  Slowly lift your right / left arm in a "V" away from your thigh, diagonally into the space between your side and straight ahead. Lift your hand to shoulder height or as far as you can, without increasing any shoulder pain. At first, many people do not lift their hands above shoulder height.  Avoid shrugging your right / left shoulder as your arm rises, by keeping your shoulder blade tucked down and toward your mid-back spine.  Hold for __________ seconds. Control the descent of your hand, as you slowly return to your starting position. Repeat __________ times. Complete this exercise __________ times per day.  STRENGTH - External Rotators  Secure a rubber exercise band or tubing to a  fixed object (table, pole) so that it is at the same height as your right / left elbow when you are standing or sitting on a firm surface.  Stand or sit so that the secured exercise band is at your uninjured side.  Bend your right / left elbow 90 degrees. Place a folded towel or small pillow under your right / left arm, so that your elbow is a few inches away from your side.  Keeping the tension on the exercise band, pull it away from your body, as if pivoting on your elbow. Be sure to keep your body steady, so that the movement is coming only from your rotating shoulder.  Hold for __________ seconds. Release the tension in a controlled manner, as you return to the starting position. Repeat __________ times. Complete this exercise __________ times per day.  STRENGTH - Internal Rotators   Secure a rubber exercise band or tubing to a fixed object (table, pole) so that it is at the same height as your right / left elbow when you are standing or sitting on a firm surface.  Stand or sit so that the secured exercise band is at your right / left side.  Bend your elbow 90 degrees. Place a folded towel or small pillow under your right  / left arm so that your elbow is a few inches away from your side.  Keeping the tension on the exercise band, pull it across your body, toward your stomach. Be sure to keep your body steady, so that the movement is coming only from your rotating shoulder.  Hold for __________ seconds. Release the tension in a controlled manner, as you return to the starting position. Repeat __________ times. Complete this exercise __________ times per day.  STRENGTH - Scapular Protractors, Standing   Stand arms length away from a wall. Place your hands on the wall, keeping your elbows straight.  Begin by dropping your shoulder blades down and toward your mid-back spine.  To strengthen your protractors, keep your shoulder blades down, but slide them forward on your rib cage. It will feel as if you are lifting the back of your rib cage away from the wall. This is a subtle motion and can be challenging to complete. Ask your caregiver for further instruction, if you are not sure you are doing the exercise correctly.  Hold for __________ seconds. Slowly return to the starting position, resting the muscles completely before starting the next repetition. Repeat __________ times. Complete this exercise __________ times per day. STRENGTH - Scapular Protractors, Supine  Lie on your back on a firm surface. Extend your right / left arm straight into the air while holding a __________ weight in your hand.  Keeping your head and back in place, lift your shoulder off the floor.  Hold for __________ seconds. Slowly return to the starting position, and allow your muscles to relax completely before starting the next repetition. Repeat __________ times. Complete this exercise __________ times per day. STRENGTH - Scapular Protractors, Quadruped  Get onto your hands and knees, with your shoulders directly over your hands (or as close as you can be, comfortably).  Keeping your elbows locked, lift the back of your rib cage up  into your shoulder blades, so your mid-back rounds out. Keep your neck muscles relaxed.  Hold this position for __________ seconds. Slowly return to the starting position and allow your muscles to relax completely before starting the next repetition. Repeat __________ times. Complete this exercise __________ times per  day.  STRENGTH - Scapular Retractors  Secure a rubber exercise band or tubing to a fixed object (table, pole), so that it is at the height of your shoulders when you are either standing, or sitting on a firm armless chair.  With a palm down grip, grasp an end of the band in each hand. Straighten your elbows and lift your hands straight in front of you, at shoulder height. Step back, away from the secured end of the band, until it becomes tense.  Squeezing your shoulder blades together, draw your elbows back toward your sides, as you bend them. Keep your upper arms lifted away from your body throughout the exercise.  Hold for __________ seconds. Slowly ease the tension on the band, as you reverse the directions and return to the starting position. Repeat __________ times. Complete this exercise __________ times per day. STRENGTH - Shoulder Extensors   Secure a rubber exercise band or tubing to a fixed object (table, pole) so that it is at the height of your shoulders when you are either standing, or sitting on a firm armless chair.  With a thumbs-up grip, grasp an end of the band in each hand. Straighten your elbows and lift your hands straight in front of you, at shoulder height. Step back, away from the secured end of the band, until it becomes tense.  Squeezing your shoulder blades together, pull your hands down to the sides of your thighs. Do not allow your hands to go behind you.  Hold for __________ seconds. Slowly ease the tension on the band, as you reverse the directions and return to the starting position. Repeat __________ times. Complete this exercise __________ times  per day.  STRENGTH - Scapular Retractors and External Rotators   Secure a rubber exercise band or tubing to a fixed object (table, pole) so that it is at the height as your shoulders, when you are either standing, or sitting on a firm armless chair.  With a palm down grip, grasp an end of the band in each hand. Bend your elbows 90 degrees and lift your elbows to shoulder height, at your sides. Step back, away from the secured end of the band, until it becomes tense.  Squeezing your shoulder blades together, rotate your shoulders so that your upper arms and elbows remain stationary, but your fists travel upward to head height.  Hold for __________ seconds. Slowly ease the tension on the band, as you reverse the directions and return to the starting position. Repeat __________ times. Complete this exercise __________ times per day.  STRENGTH - Scapular Retractors and External Rotators, Rowing   Secure a rubber exercise band or tubing to a fixed object (table, pole) so that it is at the height of your shoulders, when you are either standing, or sitting on a firm armless chair.  With a palm down grip, grasp an end of the band in each hand. Straighten your elbows and lift your hands straight in front of you, at shoulder height. Step back, away from the secured end of the band, until it becomes tense.  Step 1: Squeeze your shoulder blades together. Bending your elbows, draw your hands to your chest, as if you are rowing a boat. At the end of this motion, your hands and elbow should be at shoulder height and your elbows should be out to your sides.  Step 2: Rotate your shoulders, to raise your hands above your head. Your forearms should be vertical and your upper arms should  be horizontal.  Hold for __________ seconds. Slowly ease the tension on the band, as you reverse the directions and return to the starting position. Repeat __________ times. Complete this exercise __________ times per day.    STRENGTH - Scapular Depressors  Find a sturdy chair without wheels, such as a dining room chair.  Keeping your feet on the floor, and your hands on the chair arms, lift your bottom up from the seat, and lock your elbows.  Keeping your elbows straight, allow gravity to pull your body weight down. Your shoulders will rise toward your ears.  Raise your body against gravity by drawing your shoulder blades down your back, shortening the distance between your shoulders and ears. Although your feet should always maintain contact with the floor, your feet should progressively support less body weight, as you get stronger.  Hold for __________ seconds. In a controlled and slow manner, lower your body weight to begin the next repetition. Repeat __________ times. Complete this exercise __________ times per day.  Document Released: 09/29/2005 Document Revised: 12/22/2011 Document Reviewed: 01/11/2009 [facility] Patient Information 2015 [place], [place]. This information is not intended to replace advice given to you by your health care provider. Make sure you discuss any questions you have with your health care provider.

## 2015-06-13 ENCOUNTER — Ambulatory Visit (INDEPENDENT_AMBULATORY_CARE_PROVIDER_SITE_OTHER): Payer: Managed Care, Other (non HMO) | Admitting: Physician Assistant

## 2015-06-13 VITALS — BP 132/74 | HR 73 | Temp 99.6°F | Resp 18 | Ht 71.0 in | Wt 161.0 lb

## 2015-06-13 DIAGNOSIS — K219 Gastro-esophageal reflux disease without esophagitis: Secondary | ICD-10-CM

## 2015-06-13 DIAGNOSIS — Z299 Encounter for prophylactic measures, unspecified: Secondary | ICD-10-CM

## 2015-06-13 DIAGNOSIS — L732 Hidradenitis suppurativa: Secondary | ICD-10-CM | POA: Diagnosis not present

## 2015-06-13 DIAGNOSIS — L02411 Cutaneous abscess of right axilla: Secondary | ICD-10-CM | POA: Diagnosis not present

## 2015-06-13 DIAGNOSIS — Z418 Encounter for other procedures for purposes other than remedying health state: Secondary | ICD-10-CM

## 2015-06-13 MED ORDER — ACETAMINOPHEN ER 650 MG PO TBCR: 1300.0000 mg | EXTENDED_RELEASE_TABLET | Freq: Three times a day (TID) | ORAL | Status: DC | PRN

## 2015-06-13 MED ORDER — DOXYCYCLINE HYCLATE 100 MG PO CAPS: 100.0000 mg | ORAL_CAPSULE | Freq: Every day | ORAL | Status: DC

## 2015-06-13 MED ORDER — CEFTRIAXONE SODIUM 1 G IJ SOLR
1.0000 g | Freq: Once | INTRAMUSCULAR | Status: AC
  Administered 2015-06-13: 1 g via INTRAMUSCULAR

## 2015-06-13 MED ORDER — CEFTRIAXONE SODIUM 1 G IJ SOLR: 1.0000 g | Freq: Once | INTRAMUSCULAR | Status: DC

## 2015-06-13 MED ORDER — ESOMEPRAZOLE MAGNESIUM 40 MG PO CPDR: 40.0000 mg | DELAYED_RELEASE_CAPSULE | ORAL | Status: DC

## 2015-06-13 NOTE — Progress Notes (Signed)
06/14/2015 at 8:09 AM  Michael Ryan / DOB: [birth year] / MRN: [id number]  The patient  does not have a problem list on file.  SUBJECTIVE  Michael Ryan is a 49 y.o. well appearing male presenting for the chief complaint of a tender mass under his left arm that started 7 days ago and is worsening. Denies any inciting event.  States he has had this before and required incision and drainage. He reports that he has not felt very well today, but denies nausea, diphoresis, chills and emesis.    He is traveling to [place] in 4 days to visit his family and would like malaria prophylaxis.   He complains of a ten year history of GERD symptoms that are made worse by saucy foods.  He take Nexium as needed but continues to struggle with his symptoms.  He is a non smoker, dysphagia and odynophagia.  He has never seen a GI doctor but would like to do this after he returns from [place] in [place] days.       There is no immunization history on file for this patient.   He  has a past medical history of Back pain and GERD (gastroesophageal reflux disease).    Medications reviewed and updated by myself where necessary, and exist elsewhere in the encounter.   Michael Ryan has No Known Allergies. He  reports that he has never smoked. He has never used smokeless tobacco. He reports that he does not drink alcohol or use illicit drugs. He  has no sexual activity history on file. The patient  has no past surgical history on file.  His family history is not on file.  Review of Systems  Constitutional: Negative for fever, chills and diaphoresis.  Respiratory: Negative for cough.   Cardiovascular: Negative for chest pain.  Genitourinary: Negative for dysuria.  Neurological: Positive for headaches. Negative for dizziness.    OBJECTIVE  His  height is 5\' 11"  (1.803 m) and weight is 161 lb (73.029 kg). His oral temperature is 99.6 F (37.6 C). His blood pressure is 132/74 and his pulse is 73. His respiration is 18 and  oxygen saturation is 98%.  The patient's body mass index is 22.46 kg/(m^2).  Physical Exam  Vitals reviewed. Constitutional: He is oriented to person, place, and time. He appears well-developed. No distress.  Eyes: EOM are normal. Pupils are equal, round, and reactive to light. No scleral icterus.  Neck: Normal range of motion.  Cardiovascular: Normal rate and regular rhythm.   Respiratory: Effort normal and breath sounds normal.  GI: He exhibits no distension.  Musculoskeletal: Normal range of motion.  Neurological: He is alert and oriented to person, place, and time. No cranial nerve deficit.  Skin: Skin is warm and dry. No rash noted. He is not diaphoretic.     Psychiatric: He has a normal mood and affect.    Procedure: Risk and benefits discussed and verbal consent obtained. The patient was anesthetized using 5 cc of 1% lidocaine with epinephrine. A 1 cm incision was made with a number 11 and bloody purulent material was expressed.  The wound was not packed. The patient tolerated the procedure without difficulty.   A clean dressing was placed and wound care instructions were provided.  Philis Fendt, MS, PA-C 8:09 AM, 06/14/2015    No results found for this or any previous visit (from the past 24 hour(s)).  ASSESSMENT & PLAN  Michael Ryan was seen today for cyst and travel consult.  Diagnoses and  all orders for this visit:  Hydradenitis: Drained in the office.  Given that he has not been feeling well today with malaise will cover for infection with 1 gram ceftriaxone.    Abscess of axilla, right -     Discontinue: cefTRIAXone (ROCEPHIN) injection 1 g; Inject 1 g into the muscle once. -     acetaminophen (TYLENOL 8 HOUR) 650 MG CR tablet; Take 2 tablets (1,300 mg total) by mouth every 8 (eight) hours as needed for pain. -     Wound culture -     cefTRIAXone (ROCEPHIN) injection 1 g; Inject 1 g into the muscle once.  Prophylactic measure: Patient traveling to [place].  He is to  start taking Doxy 100 mg daily on 9/2 and continue for 1 month after return to [place].   -     doxycycline (VIBRAMYCIN) 100 MG capsule; Take 1 capsule (100 mg total) by mouth daily. Take two days before travel and continue to take for one month.   Gastroesophageal reflux disease, esophagitis presence not specified: Patient with uncontrolled symptoms for a decade.  He needs a scope. He has no alarm symptoms at this time.  He is to return for an annual physical after his trip and a GI consult will be ordered at that time.  He has been advised not to miss any doses of PPI until that time.   -     esomeprazole (NEXIUM) 40 MG capsule; Take 1 capsule (40 mg total) by mouth every morning.  Other orders -     Cancel: ibuprofen (ADVIL,MOTRIN) 800 MG tablet; Take 1 tablet (800 mg total) by mouth every 8 (eight) hours. Take 1 tab every 8 hours    The patient was advised to call or come back to clinic if he does not see an improvement in symptoms, or worsens with the above plan.   Philis Fendt, MHS, PA-C [facility] 06/14/2015 8:09 AM

## 2015-06-16 LAB — WOUND CULTURE: Gram Stain: NONE SEEN

## 2015-07-11 ENCOUNTER — Ambulatory Visit (INDEPENDENT_AMBULATORY_CARE_PROVIDER_SITE_OTHER): Payer: Managed Care, Other (non HMO) | Admitting: Physician Assistant

## 2015-07-11 DIAGNOSIS — Z8719 Personal history of other diseases of the digestive system: Secondary | ICD-10-CM

## 2015-07-11 DIAGNOSIS — Z0000 Encounter for general adult medical examination without abnormal findings: Secondary | ICD-10-CM

## 2015-07-11 DIAGNOSIS — Z139 Encounter for screening, unspecified: Secondary | ICD-10-CM

## 2015-07-11 DIAGNOSIS — Z23 Encounter for immunization: Secondary | ICD-10-CM

## 2015-07-11 NOTE — Progress Notes (Deleted)
   07/11/2015 at 2:05 PM  Michael Ryan / DOB: Jul 03, 1966 / MRN: [id number]  The patient  does not have a problem list on file.  SUBJECTIVE  Michael Ryan is a 49 y.o. {DESC; WELL/ILL:30681} appearing male presenting for the chief complaint of annual physical. He denies a history of smoking.  He has never been screened for HIV or syphilis per [facility].    Patient has a history of GERD and per my last note would benefit from a scope.    There is no immunization history for the selected administration types on file for this patient.  Depression screen PHQ 2/9 06/13/2015  Decreased Interest 0  Down, Depressed, Hopeless 0  PHQ - 2 Score 0    He  has a past medical history of Back pain and GERD (gastroesophageal reflux disease).    Medications reviewed and updated by myself where necessary, and exist elsewhere in the encounter.   [name] has No Known Allergies. He  reports that he has never smoked. He has never used smokeless tobacco. He reports that he does not drink alcohol or use illicit drugs. He  has no sexual activity history on file. The patient  has no past surgical history on file.  His family history is not on file.  ROS  OBJECTIVE  His  vitals were not taken for this visit. The patient's body mass index is unknown because there is no weight on file.  Physical Exam  No results found for this or any previous visit (from the past 24 hour(s)).  ASSESSMENT & PLAN  Diagnoses and all orders for this visit:  Annual physical exam  Need for prophylactic vaccination and inoculation against influenza -     Flu Vaccine QUAD 36+ mos IM  Need for Tdap vaccination -     Tdap vaccine greater than or equal to 7yo IM  Screening -     Hemoglobin A1c -     CBC with Differential/Platelet -     COMPLETE METABOLIC PANEL WITH GFR -     Lipid panel -     Hepatitis C Ab Reflex HCV RNA, QUANT  History of gastroesophageal reflux (GERD) -     Ambulatory referral to  Gastroenterology   The patient was advised to call or come back to clinic if he does not see an improvement in symptoms, or worsens with the above plan.   Philis Fendt, MHS, PA-C [facility] 07/11/2015 2:05 PM

## 2015-07-14 NOTE — Progress Notes (Signed)
Patient did not present for appointment. Philis Fendt, MS, PA-C   9:08 PM, 07/14/2015

## 2016-05-24 ENCOUNTER — Ambulatory Visit (INDEPENDENT_AMBULATORY_CARE_PROVIDER_SITE_OTHER): Payer: Managed Care, Other (non HMO) | Admitting: Physician Assistant

## 2016-05-24 ENCOUNTER — Encounter: Payer: Self-pay | Admitting: Physician Assistant

## 2016-05-24 VITALS — BP 120/80 | HR 90 | Temp 97.9°F | Ht 70.0 in | Wt 160.0 lb

## 2016-05-24 DIAGNOSIS — L739 Follicular disorder, unspecified: Secondary | ICD-10-CM | POA: Diagnosis not present

## 2016-05-24 DIAGNOSIS — L03818 Cellulitis of other sites: Secondary | ICD-10-CM | POA: Diagnosis not present

## 2016-05-24 DIAGNOSIS — L0291 Cutaneous abscess, unspecified: Secondary | ICD-10-CM

## 2016-05-24 MED ORDER — DOXYCYCLINE HYCLATE 100 MG PO CAPS: 100.0000 mg | ORAL_CAPSULE | Freq: Two times a day (BID) | ORAL | 0 refills | Status: DC

## 2016-05-24 NOTE — Progress Notes (Signed)
   Michael Ryan  MRN: [id number] DOB: Jan 18, 1966  PCP: No PCP Per Patient  Subjective:  Pt presents to clinic for left finger pain x 4 days. At that time his third finger became painful, swollen and hot.  He has trouble making a fist and it is tender to touch.  Now the swelling is in his hand. He says his left hand and wrist "don't feel normal".  A few days ago he says he didn't fells well, citing headache and fever. He did not take his temperature. He has not done anything for his finger.   He says he had a similar episode last year in both arm pits. Came to [facility] for treatment.   Review of Systems  Constitutional: Positive for chills and fever ("felt hot"). Negative for diaphoresis and fatigue.  Respiratory: Negative for cough, chest tightness, shortness of breath and wheezing.   Cardiovascular: Negative for chest pain and palpitations.  Skin: Positive for wound. Negative for color change, pallor and rash.  Neurological: Positive for headaches. Negative for dizziness, light-headedness and numbness.    There are no active problems to display for this patient.   Current Outpatient Prescriptions on File Prior to Visit  Medication Sig Dispense Refill  . esomeprazole (NEXIUM) 40 MG capsule Take 1 capsule (40 mg total) by mouth every morning. 90 capsule 0  . acetaminophen (TYLENOL 8 HOUR) 650 MG CR tablet Take 2 tablets (1,300 mg total) by mouth every 8 (eight) hours as needed for pain. (Patient not taking: Reported on 05/24/2016) 15 tablet 0   No current facility-administered medications on file prior to visit.     No Known Allergies  Objective:  BP 120/80   Pulse 90   Temp 97.9 F (36.6 C)   Ht 5\' 10"  (1.778 m)   Wt 160 lb (72.6 kg)   SpO2 99%   BMI 22.96 kg/m   Physical Exam  Constitutional: He is oriented to person, place, and time and well-developed, well-nourished, and in no distress. No distress.  HENT:  Head: Normocephalic and atraumatic.  Pulmonary/Chest: Effort  normal and breath sounds normal.  Musculoskeletal:       Right hand: He exhibits decreased range of motion (3rd finger), tenderness and swelling. He exhibits normal capillary refill and no deformity.       Hands: Neurological: He is alert and oriented to person, place, and time. Gait normal.  Skin: Skin is warm and dry. He is not diaphoretic.  Psychiatric: Mood, memory, affect and judgment normal.  Vitals reviewed.  Procedure Unroofed pustule to find a small amount of pussy drainage. Collected and sent culture.   Assessment and Plan :  1. Folliculitis  2. Cellulitis of other specified site - doxycycline (VIBRAMYCIN) 100 MG capsule; Take 1 capsule (100 mg total) by mouth 2 (two) times daily.  Dispense: 20 capsule; Refill: 0 - WOUND CULTURE - Patient asked to RTC if symptoms worsen. Will call patient if culture results return indicating antibiotic treatment needs changing.    Mercer Pod, PA-C  [facility] 05/24/2016 9:30 AM

## 2016-05-24 NOTE — Patient Instructions (Signed)
     IF you received an x-ray today, you will receive an invoice from [facility]. Please contact [facility] at [phone] with questions or concerns regarding your invoice.   IF you received labwork today, you will receive an invoice from [facility]. Please contact [facility] at [phone] with questions or concerns regarding your invoice.   Our billing staff will not be able to assist you with questions regarding bills from these companies.  You will be contacted with the lab results as soon as they are available. The fastest way to get your results is to activate your My Chart account. Instructions are located on the last page of this paperwork. If you have not heard from us regarding the results in 2 weeks, please contact this office.      

## 2016-05-26 ENCOUNTER — Ambulatory Visit (INDEPENDENT_AMBULATORY_CARE_PROVIDER_SITE_OTHER): Payer: Managed Care, Other (non HMO) | Admitting: Family Medicine

## 2016-05-26 VITALS — BP 118/64 | HR 84 | Temp 99.5°F | Resp 18 | Ht 70.0 in | Wt 158.4 lb

## 2016-05-26 DIAGNOSIS — Z136 Encounter for screening for cardiovascular disorders: Secondary | ICD-10-CM | POA: Diagnosis not present

## 2016-05-26 DIAGNOSIS — Z0000 Encounter for general adult medical examination without abnormal findings: Secondary | ICD-10-CM

## 2016-05-26 DIAGNOSIS — Z1383 Encounter for screening for respiratory disorder NEC: Secondary | ICD-10-CM

## 2016-05-26 DIAGNOSIS — Z113 Encounter for screening for infections with a predominantly sexual mode of transmission: Secondary | ICD-10-CM

## 2016-05-26 DIAGNOSIS — K219 Gastro-esophageal reflux disease without esophagitis: Secondary | ICD-10-CM

## 2016-05-26 DIAGNOSIS — Z1389 Encounter for screening for other disorder: Secondary | ICD-10-CM | POA: Diagnosis not present

## 2016-05-26 DIAGNOSIS — Z130 Encounter for screening for diseases of the blood and blood-forming organs and certain disorders involving the immune mechanism: Secondary | ICD-10-CM | POA: Diagnosis not present

## 2016-05-26 DIAGNOSIS — Z1329 Encounter for screening for other suspected endocrine disorder: Secondary | ICD-10-CM

## 2016-05-26 LAB — POCT URINALYSIS DIP (MANUAL ENTRY)
BILIRUBIN UA: NEGATIVE
BILIRUBIN UA: NEGATIVE
Blood, UA: NEGATIVE
Glucose, UA: NEGATIVE
LEUKOCYTES UA: NEGATIVE
Nitrite, UA: NEGATIVE
Spec Grav, UA: 1.02
Urobilinogen, UA: 0.2
pH, UA: 6

## 2016-05-26 MED ORDER — RANITIDINE HCL 150 MG PO TABS: 150.0000 mg | ORAL_TABLET | Freq: Two times a day (BID) | ORAL | 0 refills | Status: DC

## 2016-05-26 MED ORDER — SUCRALFATE 1 G PO TABS: 1.0000 g | ORAL_TABLET | Freq: Three times a day (TID) | ORAL | 0 refills | Status: DC

## 2016-05-26 NOTE — Patient Instructions (Addendum)
You can return to clinic in 3 weeks for your breath test for your stomach and for your fasting labs.  Do not use nexium or bismuth (peptobismol) for the next 3 weeks, try to use the ranitidine as minimally as possible but it is better to use than prilosec, protonix, nexium or other over the counter medication for acid reflux.  As soon as you have the breath test done, you can restart the nexium.   Remember to be fasting for you labs - nothing but water or black coffee for at least 8 hours beforehand.  IF you received an x-ray today, you will receive an invoice from [facility]. Please contact [facility] at [phone] with questions or concerns regarding your invoice.   IF you received labwork today, you will receive an invoice from [facility]. Please contact [facility] at [phone] with questions or concerns regarding your invoice.   Our billing staff will not be able to assist you with questions regarding bills from these companies.  You will be contacted with the lab results as soon as they are available. The fastest way to get your results is to activate your My Chart account. Instructions are located on the last page of this paperwork. If you have not heard from [place] regarding the results in 2 weeks, please contact this office.     Keeping you healthy  Get these tests  Blood pressure- Have your blood pressure checked once a year by your healthcare provider.  Normal blood pressure is 120/80.  Weight- Have your body mass index (BMI) calculated to screen for obesity.  BMI is a measure of body fat based on height and weight. You can also calculate your own BMI at [employer]velB[employer].it.  Cholesterol- Have your cholesterol checked regularly starting at age 26, sooner may be necessary if you have diabetes, high blood pressure, if a family member developed heart diseases at an early age or if you smoke.   Chlamydia, HIV, and other sexual  transmitted disease- Get screened each year until the age of 62 then within three months of each new sexual partner.  Diabetes- Have your blood sugar checked regularly if you have high blood pressure, high cholesterol, a family history of diabetes or if you are overweight.  Get these vaccines  Flu shot- Every fall.  Tetanus shot- Every 10 years.  Menactra- Single dose; prevents meningitis.  Take these steps  Don't smoke- If you do smoke, ask your healthcare provider about quitting. For tips on how to quit, go to www.smokefree.gov or call 1-800-QUIT-NOW.  Be physically active- Exercise 5 days a week for at least 30 minutes.  If you are not already physically active start slow and gradually work up to 30 minutes of moderate physical activity.  Examples of moderate activity include walking briskly, mowing the yard, dancing, swimming bicycling, etc.  Eat a healthy diet- Eat a variety of healthy foods such as fruits, vegetables, low fat milk, low fat cheese, yogurt, lean meats, poultry, fish, beans, tofu, etc.  For more information on healthy eating, go to www.thenutritionsource.org  Drink alcohol in moderation- Limit alcohol intake two drinks or less a day.  Never drink and drive.  Dentist- Brush and floss teeth twice daily; visit your dentis twice a year.  Depression-Your emotional health is as important as your physical health.  If you're feeling down, losing interest in things you normally enjoy please talk with your healthcare provider.  Gun Safety- If you keep a gun in your home,  keep it unloaded and with the safety lock on.  Bullets should be stored separately.  Helmet use- Always wear a helmet when riding a motorcycle, bicycle, rollerblading or skateboarding.  Safe sex- If you may be exposed to a sexually transmitted infection, use a condom  Seat belts- Seat bels can save your life; always wear one.  Smoke/Carbon Monoxide detectors- These detectors need to be installed on the  appropriate level of your home.  Replace batteries at least once a year.  Skin Cancer- When out in the sun, cover up and use sunscreen SPF 15 or higher.  Violence- If anyone is threatening or hurting you, please tell your healthcare provider.

## 2016-05-28 LAB — WOUND CULTURE: Gram Stain: NONE SEEN

## 2016-05-28 LAB — GC/CHLAMYDIA PROBE AMP
CT Probe RNA: NOT DETECTED
GC Probe RNA: NOT DETECTED

## 2016-05-28 LAB — TRICHOMONAS VAGINALIS, PROBE AMP: TRICHOMONAS VAGINALIS PROBE APTIMA: NOT DETECTED

## 2016-05-30 ENCOUNTER — Encounter: Payer: Self-pay | Admitting: Family Medicine

## 2016-05-31 NOTE — Progress Notes (Signed)
Subjective:  By signing my name below, I, Moises Blood, attest that this documentation has been prepared under the direction and in the presence of Delman Cheadle, MD. Electronically Signed: Moises Blood, [place]. 05/31/2016 , 5:51 PM .  Patient was seen in Room 11 .   Patient ID: Michael Ryan, male    DOB: [birth year], 50 y.o.   MRN: [id number] Chief Complaint  Patient presents with  . Annual Exam    CPE   HPI Michael Ryan is a 50 y.o. male who presents to [facility].  He's not fasting today; he will return for fasting labwork at another day.   Cancer Screening He denies family history of cancer. He has family history of HTN.  He sometimes has to wait for his urine stream to start. He denies nocturia.   GERD He takes nexium 40mg  for his GERD. If he stops taking the nexium, his GERD will recur after 2 days. He hasn't seen a GI. He denies any bowel symptoms or constipation.   Back pinching He also reports a slight pinching over his mid-thoracic back.   Exercise He denies going to the gym. He's very active at his job.   Diet He eats mostly rice and meat.   Supplements He takes vitamins.   Vision He sees his eye doctor annually.    Visual Acuity Screening   Right eye Left eye Both eyes  Without correction:     With correction: 20/20 20/20 20/25     Immunization He believes he's up to date on his tetanus, which was done in 2014.   Past Medical History:  Diagnosis Date  . Back pain   . GERD (gastroesophageal reflux disease)    Prior to Admission medications   Medication Sig Start Date End Date Taking? Authorizing Provider  doxycycline (VIBRAMYCIN) 100 MG capsule Take 1 capsule (100 mg total) by mouth 2 (two) times daily. 05/24/16  Yes Ree Shay McVey, PA-C  esomeprazole (NEXIUM) 40 MG capsule Take 1 capsule (40 mg total) by mouth every morning. 06/13/15  Yes Tereasa Coop, PA-C  acetaminophen (TYLENOL 8 HOUR) 650 MG CR tablet Take 2 tablets (1,300  mg total) by mouth every 8 (eight) hours as needed for pain. Patient not taking: Reported on 05/24/2016 06/13/15   Tereasa Coop, PA-C   No Known Allergies   No past surgical history on file. Social History   Social History  . Marital status: Divorced    Spouse name: N/A  . Number of children: N/A  . Years of education: N/A   Social History Main Topics  . Smoking status: Never Smoker  . Smokeless tobacco: Never Used  . Alcohol use No  . Drug use: No  . Sexual activity: Not Asked   Other Topics Concern  . None   Social History Narrative  . None   No family history on file.   Review of Systems  Constitutional: Negative for fatigue and unexpected weight change.  Eyes: Negative for visual disturbance.  Respiratory: Negative for cough, chest tightness and shortness of breath.   Cardiovascular: Negative for chest pain, palpitations and leg swelling.  Gastrointestinal: Negative for abdominal pain, blood in stool and constipation.  Musculoskeletal: Positive for back pain.  Neurological: Negative for dizziness, light-headedness and headaches.  All other systems reviewed and are negative.      Objective:   Physical Exam  Constitutional: He is oriented to person, place, and time. He appears well-developed and well-nourished. No distress.  HENT:  Head: Normocephalic and atraumatic.  Right Ear: Tympanic membrane normal.  Left Ear: Tympanic membrane normal.  Nose: Nose normal.  Mouth/Throat: Oropharynx is clear and moist.  Eyes: EOM are normal. Pupils are equal, round, and reactive to light.  Neck: Neck supple. No thyromegaly present.  Cardiovascular: Normal rate.   Pulses:      Dorsalis pedis pulses are 2+ on the right side, and 2+ on the left side.  Pulmonary/Chest: Effort normal and breath sounds normal. No respiratory distress. He has no wheezes.  Abdominal: Soft. He exhibits no distension. Bowel sounds are decreased. There is no hepatosplenomegaly. There is no  tenderness. There is no rebound, no guarding and no CVA tenderness.  Musculoskeletal: Normal range of motion. He exhibits no edema.  Pain over approximately T7-T8  Lymphadenopathy:    He has no cervical adenopathy.  Neurological: He is alert and oriented to person, place, and time.  Reflex Scores:      Patellar reflexes are 2+ on the right side and 2+ on the left side. Skin: Skin is warm and dry.  Psychiatric: He has a normal mood and affect. His behavior is normal.  Nursing note and vitals reviewed.   BP 118/64   Pulse 84   Temp 99.5 F (37.5 C) (Oral)   Resp 18   Ht 5\' 10"  (1.778 m)   Wt 158 lb 6.4 oz (71.8 kg)   SpO2 98%   BMI 22.73 kg/m    Results for orders placed or performed in visit on 05/26/16  GC/Chlamydia Probe Amp  Result Value Ref Range   CT Probe RNA NOT DETECTED    GC Probe RNA NOT DETECTED   Trichomonas vaginalis, RNA  Result Value Ref Range   T vaginalis RNA Not Detected   POCT urinalysis dipstick  Result Value Ref Range   Color, UA yellow yellow   Clarity, UA clear clear   Glucose, UA negative negative   Bilirubin, UA negative negative   Ketones, POC UA negative negative   Spec Grav, UA 1.020    Blood, UA negative negative   pH, UA 6.0    Protein Ur, POC =30 (A) negative   Urobilinogen, UA 0.2    Nitrite, UA Negative Negative   Leukocytes, UA Negative Negative       Assessment & Plan:   1. Annual physical exam   2. Screening for cardiovascular, respiratory, and genitourinary diseases   3. Screening for deficiency anemia   4. Screening for thyroid disorder   5. Gastroesophageal reflux disease, esophagitis presence not specified - stop ppi since he is still freq having breakthrough sxs on esomeprazole 40 qd. Use sucralfate and prn ranitidine to control sxs for the next 3 wks then RTC for h. Pylori breath test - ok to do lab only visit for this.  6. Screen for STD (sexually transmitted disease)     Orders Placed This Encounter  Procedures  .  GC/Chlamydia Probe Amp  . Trichomonas vaginalis, RNA  . H. pylori breath test    Standing Status:   Future    Standing Expiration Date:   05/31/2017  . POCT urinalysis dipstick    Meds ordered this encounter  Medications  . ranitidine (ZANTAC) 150 MG tablet    Sig: Take 1 tablet (150 mg total) by mouth 2 (two) times daily.    Dispense:  60 tablet    Refill:  0  . sucralfate (CARAFATE) 1 g tablet    Sig: Take 1 tablet (1 g total)  by mouth 4 (four) times daily -  with meals and at bedtime.    Dispense:  120 tablet    Refill:  0    I personally performed the services described in this documentation, which was scribed in my presence. The recorded information has been reviewed and considered, and addended by me as needed.   Delman Cheadle, M.D.  [facility] [address] [place], [place] [address] [phone] phone [phone] fax  05/31/16 11:14 PM

## 2016-06-28 ENCOUNTER — Other Ambulatory Visit (INDEPENDENT_AMBULATORY_CARE_PROVIDER_SITE_OTHER): Payer: Managed Care, Other (non HMO)

## 2016-06-28 DIAGNOSIS — K219 Gastro-esophageal reflux disease without esophagitis: Secondary | ICD-10-CM | POA: Diagnosis not present

## 2016-06-30 LAB — H. PYLORI BREATH TEST: H. PYLORI BREATH TEST: NOT DETECTED

## 2016-07-07 ENCOUNTER — Encounter: Payer: Self-pay | Admitting: Family Medicine

## 2017-08-18 ENCOUNTER — Ambulatory Visit (INDEPENDENT_AMBULATORY_CARE_PROVIDER_SITE_OTHER): Payer: Managed Care, Other (non HMO) | Admitting: Physician Assistant

## 2017-08-18 ENCOUNTER — Ambulatory Visit (INDEPENDENT_AMBULATORY_CARE_PROVIDER_SITE_OTHER): Payer: Managed Care, Other (non HMO)

## 2017-08-18 ENCOUNTER — Encounter: Payer: Self-pay | Admitting: Physician Assistant

## 2017-08-18 ENCOUNTER — Ambulatory Visit: Payer: Managed Care, Other (non HMO) | Admitting: Emergency Medicine

## 2017-08-18 VITALS — BP 118/80 | HR 76 | Temp 98.9°F | Resp 18 | Ht 72.0 in | Wt 159.6 lb

## 2017-08-18 DIAGNOSIS — M79642 Pain in left hand: Secondary | ICD-10-CM | POA: Diagnosis not present

## 2017-08-18 DIAGNOSIS — M542 Cervicalgia: Secondary | ICD-10-CM

## 2017-08-18 DIAGNOSIS — M5441 Lumbago with sciatica, right side: Secondary | ICD-10-CM

## 2017-08-18 DIAGNOSIS — M4722 Other spondylosis with radiculopathy, cervical region: Secondary | ICD-10-CM

## 2017-08-18 DIAGNOSIS — K219 Gastro-esophageal reflux disease without esophagitis: Secondary | ICD-10-CM | POA: Insufficient documentation

## 2017-08-18 DIAGNOSIS — M47816 Spondylosis without myelopathy or radiculopathy, lumbar region: Secondary | ICD-10-CM | POA: Diagnosis not present

## 2017-08-18 DIAGNOSIS — M25561 Pain in right knee: Secondary | ICD-10-CM | POA: Diagnosis not present

## 2017-08-18 DIAGNOSIS — M47812 Spondylosis without myelopathy or radiculopathy, cervical region: Secondary | ICD-10-CM | POA: Insufficient documentation

## 2017-08-18 MED ORDER — CYCLOBENZAPRINE HCL 10 MG PO TABS: 10.0000 mg | ORAL_TABLET | Freq: Three times a day (TID) | ORAL | 0 refills | Status: DC | PRN

## 2017-08-18 MED ORDER — MELOXICAM 15 MG PO TABS: 15.0000 mg | ORAL_TABLET | Freq: Every day | ORAL | 1 refills | Status: DC

## 2017-08-18 NOTE — Patient Instructions (Addendum)
The muscle relaxer causes drowsiness. DO NOT TAKE IT WHEN DRIVING. APply a warm compress to the sore muscles for 20 minutes 2-4 times daily.   IF you received an x-ray today, you will receive an invoice from [facility]. Please contact [facility] at [phone] with questions or concerns regarding your invoice.   IF you received labwork today, you will receive an invoice from [facility]. Please contact [facility] at [phone] with questions or concerns regarding your invoice.   Our billing staff will not be able to assist you with questions regarding bills from these companies.  You will be contacted with the lab results as soon as they are available. The fastest way to get your results is to activate your My Chart account. Instructions are located on the last page of this paperwork. If you have not heard from [place] regarding the results in 2 weeks, please contact this office.
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
## 2017-08-20 ENCOUNTER — Telehealth: Payer: Self-pay | Admitting: Family Medicine

## 2017-08-20 NOTE — Telephone Encounter (Signed)
Results request to [facility] re: xrays

## 2017-08-20 NOTE — Telephone Encounter (Signed)
Copied from [place] [phone]. Topic: Quick Communication - See Telephone Encounter >> Aug 20, 2017 11:42 AM Burnis Medin, NT wrote: CRM for notification. See Telephone encounter for: Pt. Is calling back about getting results from his X- RAY  08/20/17.

## 2017-08-21 NOTE — Telephone Encounter (Signed)
Result note routed to CMA/RAD staff pool the day of the patient's visit. See result notes, including patient contact.

## 2018-03-02 ENCOUNTER — Ambulatory Visit: Payer: 59 | Admitting: Emergency Medicine

## 2018-03-02 ENCOUNTER — Encounter: Payer: Self-pay | Admitting: Emergency Medicine

## 2018-03-02 ENCOUNTER — Ambulatory Visit (INDEPENDENT_AMBULATORY_CARE_PROVIDER_SITE_OTHER): Payer: 59

## 2018-03-02 ENCOUNTER — Other Ambulatory Visit: Payer: Self-pay

## 2018-03-02 VITALS — BP 96/70 | HR 71 | Temp 98.3°F | Resp 16 | Ht 70.25 in | Wt 156.2 lb

## 2018-03-02 DIAGNOSIS — M940 Chondrocostal junction syndrome [Tietze]: Secondary | ICD-10-CM | POA: Insufficient documentation

## 2018-03-02 DIAGNOSIS — R079 Chest pain, unspecified: Secondary | ICD-10-CM

## 2018-03-02 MED ORDER — DICLOFENAC SODIUM 75 MG PO TBEC: 75.0000 mg | DELAYED_RELEASE_TABLET | Freq: Two times a day (BID) | ORAL | 0 refills | Status: AC

## 2018-03-02 NOTE — Progress Notes (Signed)
Michael Ryan 52 y.o.   Chief Complaint  Patient presents with  . Back Pain    upper x 2 month - per patient saw Dr Carlos American in [place]  . Chest Pain    upper area x 2 month    HISTORY OF PRESENT ILLNESS: This is a 52 y.o. male complaining of right-sided chest pain that started about 2 months ago.  Pain is sharp, almost constant, with some radiation into the right scapular area.  No associated symptoms.  Worse with deep breathing.  Recently traveled to [place] where he saw a doctor but no definite diagnosis made.  Told he had a stomach problem with heartburn.  HPI   Prior to Admission medications   Medication Sig Start Date End Date Taking? Authorizing Provider  cyclobenzaprine (FLEXERIL) 10 MG tablet Take 1 tablet (10 mg total) 3 (three) times daily as needed by mouth for muscle spasms. Patient not taking: Reported on 03/02/2018 08/18/17   Harrison Mons, PA-C  esomeprazole (NEXIUM) 40 MG capsule Take 1 capsule (40 mg total) by mouth every morning. Patient not taking: Reported on 03/02/2018 06/13/15   Tereasa Coop, PA-C  meloxicam (MOBIC) 15 MG tablet Take 1 tablet (15 mg total) daily by mouth. Patient not taking: Reported on 03/02/2018 08/18/17   Harrison Mons, PA-C  ranitidine (ZANTAC) 150 MG tablet Take 1 tablet (150 mg total) by mouth 2 (two) times daily. Patient not taking: Reported on 08/18/2017 05/26/16   Shawnee Knapp, MD  sucralfate (CARAFATE) 1 g tablet Take 1 tablet (1 g total) by mouth 4 (four) times daily -  with meals and at bedtime. Patient not taking: Reported on 08/18/2017 05/26/16   Shawnee Knapp, MD    No Known Allergies  Patient Active Problem List   Diagnosis Date Noted  . GERD without esophagitis 08/18/2017  . 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 Degenerative joint disease of cervical spine 08/18/2017    Past Medical History:  Diagnosis Date  . Back pain   . GERD (gastroesophageal reflux disease)     No past surgical history on  file.  Social History   Socioeconomic History  . Marital status: Divorced    Spouse name: Not on file  . Number of children: Not on file  . Years of education: Not on file  . Highest education level: Not on file  Occupational History  . Not on file  Social Needs  . Financial resource strain: Not on file  . Food insecurity:    Worry: Not on file    Inability: Not on file  . Transportation needs:    Medical: Not on file    Non-medical: Not on file  Tobacco Use  . Smoking status: Never Smoker  . Smokeless tobacco: Never Used  Substance and Sexual Activity  . Alcohol use: No  . Drug use: No  . Sexual activity: Not on file  Lifestyle  . Physical activity:    Days per week: Not on file    Minutes per session: Not on file  . Stress: Not on file  Relationships  . Social connections:    Talks on phone: Not on file    Gets together: Not on file    Attends religious service: Not on file    Active member of club or organization: Not on file    Attends meetings of clubs or organizations: Not on file    Relationship status: Not on file  . Intimate partner violence:  Fear of current or ex partner: Not on file    Emotionally abused: Not on file    Physically abused: Not on file    Forced sexual activity: Not on file  Other Topics Concern  . Not on file  Social History Narrative  . Not on file    No family history on file.   Review of Systems  Constitutional: Negative.  Negative for chills, fever and malaise/fatigue.  HENT: Negative.  Negative for congestion, nosebleeds and sore throat.   Eyes: Negative.  Negative for blurred vision and double vision.  Respiratory: Negative.  Negative for cough, hemoptysis, sputum production, shortness of breath and wheezing.   Cardiovascular: Positive for chest pain. Negative for palpitations, claudication and leg swelling.  Gastrointestinal: Negative.  Negative for abdominal pain, blood in stool, diarrhea, nausea and vomiting.    Genitourinary: Negative.  Negative for dysuria, flank pain and hematuria.  Musculoskeletal: Negative.  Negative for back pain, myalgias and neck pain.  Skin: Negative.  Negative for rash.  Neurological: Negative.  Negative for dizziness, sensory change, focal weakness and headaches.  Endo/Heme/Allergies: Negative.   All other systems reviewed and are negative.  Vitals:   03/02/18 1725  BP: 96/70  Pulse: 71  Resp: 16  Temp: 98.3 F (36.8 C)  SpO2: 98%     Physical Exam  Constitutional: He is oriented to person, place, and time. He appears well-developed and well-nourished.  HENT:  Head: Normocephalic and atraumatic.  Right Ear: External ear normal.  Left Ear: External ear normal.  Nose: Nose normal.  Mouth/Throat: Oropharynx is clear and moist.  Eyes: Pupils are equal, round, and reactive to light. Conjunctivae and EOM are normal.  Neck: Normal range of motion. Neck supple. No JVD present. No thyromegaly present.  Cardiovascular: Normal rate, regular rhythm, normal heart sounds and intact distal pulses.  Pulmonary/Chest: Effort normal and breath sounds normal.  Abdominal: Soft. Bowel sounds are normal. He exhibits no distension. There is no tenderness.  Musculoskeletal: Normal range of motion. He exhibits no edema.  Lymphadenopathy:    He has no cervical adenopathy.  Neurological: He is alert and oriented to person, place, and time. No sensory deficit. He exhibits normal muscle tone.  Skin: Skin is warm and dry. Capillary refill takes less than 2 seconds.  Psychiatric: He has a normal mood and affect. His behavior is normal.  Vitals reviewed. Dg Chest 2 View  Result Date: 03/02/2018 CLINICAL DATA:  Chest pain EXAM: CHEST - 2 VIEW COMPARISON:  Chest radiograph Mar 11, 2014 and chest CT Mar 11, 2014 FINDINGS: There is no edema or consolidation. The heart size and pulmonary vascularity are normal. No adenopathy. No pneumothorax. No bone lesions. IMPRESSION: No edema or  consolidation. Electronically Signed   By: Lowella Grip III M.D.   On: 03/02/2018 18:18     EKG: Normal sinus rhythm with ventricular rate of 67.  No acute ischemic changes.  Normal intervals.  Compared with EKG done on Mar 11, 2014.  No changes. ASSESSMENT & PLAN: Jayshawn was seen today for back pain and chest pain.  Diagnoses and all orders for this visit:  Chest pain, unspecified type -     EKG 12-Lead -     DG Chest 2 View; Future  Costochondritis -     diclofenac (VOLTAREN) 75 MG EC tablet; Take 1 tablet (75 mg total) by mouth 2 (two) times daily for 5 days.    Patient Instructions       IF you  received an x-ray today, you will receive an invoice from [facility]. Please contact [facility] at [phone] with questions or concerns regarding your invoice.   IF you received labwork today, you will receive an invoice from [facility]. Please contact [facility] at [phone] with questions or concerns regarding your invoice.   Our billing staff will not be able to assist you with questions regarding bills from these companies.  You will be contacted with the lab results as soon as they are available. The fastest way to get your results is to activate your My Chart account. Instructions are located on the last page of this paperwork. If you have not heard from [place] regarding the results in 2 weeks, please contact this office.     Costochondritis Costochondritis is swelling and irritation (inflammation) of the tissue (cartilage) that connects your ribs to your breastbone (sternum). This causes pain in the front of your chest. Usually, the pain:  Starts gradually.  Is in more than one rib.  This condition usually goes away on its own over time. Follow these instructions at home:  Do not do anything that makes your pain worse.  If directed, put ice on the painful area: ? Put ice in a plastic bag. ? Place a towel between your skin and the  bag. ? Leave the ice on for 20 minutes, 2-3 times a day.  If directed, put heat on the affected area as often as told by your doctor. Use the heat source that your doctor tells you to use, such as a moist heat pack or a heating pad. ? Place a towel between your skin and the heat source. ? Leave the heat on for 20-30 minutes. ? Take off the heat if your skin turns bright red. This is very important if you cannot feel pain, heat, or cold. You may have a greater risk of getting burned.  Take over-the-counter and prescription medicines only as told by your doctor.  Return to your normal activities as told by your doctor. Ask your doctor what activities are safe for you.  Keep all follow-up visits as told by your doctor. This is important. Contact a doctor if:  You have chills or a fever.  Your pain does not go away or it gets worse.  You have a cough that does not go away. Get help right away if:  You are short of breath. This information is not intended to replace advice given to you by your health care provider. Make sure you discuss any questions you have with your health care provider. Document Released: 03/17/2008 Document Revised: 04/18/2016 Document Reviewed: 01/23/2016 Elsevier Interactive Patient Education  2018 Elsevier Inc.      Agustina Caroli, MD [facility]

## 2018-03-02 NOTE — Patient Instructions (Addendum)
     IF you received an x-ray today, you will receive an invoice from [facility]. Please contact [facility] at [phone] with questions or concerns regarding your invoice.   IF you received labwork today, you will receive an invoice from [facility]. Please contact [facility] at [phone] with questions or concerns regarding your invoice.   Our billing staff will not be able to assist you with questions regarding bills from these companies.  You will be contacted with the lab results as soon as they are available. The fastest way to get your results is to activate your My Chart account. Instructions are located on the last page of this paperwork. If you have not heard from us regarding the results in 2 weeks, please contact this office.     Costochondritis Costochondritis is swelling and irritation (inflammation) of the tissue (cartilage) that connects your ribs to your breastbone (sternum). This causes pain in the front of your chest. Usually, the pain:  Starts gradually.  Is in more than one rib.  This condition usually goes away on its own over time. Follow these instructions at home:  Do not do anything that makes your pain worse.  If directed, put ice on the painful area: ? Put ice in a plastic bag. ? Place a towel between your skin and the bag. ? Leave the ice on for 20 minutes, 2-3 times a day.  If directed, put heat on the affected area as often as told by your doctor. Use the heat source that your doctor tells you to use, such as a moist heat pack or a heating pad. ? Place a towel between your skin and the heat source. ? Leave the heat on for 20-30 minutes. ? Take off the heat if your skin turns bright red. This is very important if you cannot feel pain, heat, or cold. You may have a greater risk of getting burned.  Take over-the-counter and prescription medicines only as told by your doctor.  Return to your normal activities as told by your doctor.  Ask your doctor what activities are safe for you.  Keep all follow-up visits as told by your doctor. This is important. Contact a doctor if:  You have chills or a fever.  Your pain does not go away or it gets worse.  You have a cough that does not go away. Get help right away if:  You are short of breath. This information is not intended to replace advice given to you by your health care provider. Make sure you discuss any questions you have with your health care provider. Document Released: 03/17/2008 Document Revised: 04/18/2016 Document Reviewed: 01/23/2016 Elsevier Interactive Patient Education  2018 Elsevier Inc.  

## 2018-08-30 ENCOUNTER — Encounter: Payer: Self-pay | Admitting: Emergency Medicine

## 2018-08-30 ENCOUNTER — Other Ambulatory Visit: Payer: Self-pay

## 2018-08-30 ENCOUNTER — Ambulatory Visit (INDEPENDENT_AMBULATORY_CARE_PROVIDER_SITE_OTHER): Payer: 59 | Admitting: Emergency Medicine

## 2018-08-30 VITALS — BP 115/76 | HR 74 | Temp 98.4°F | Resp 16 | Ht 70.0 in | Wt 153.8 lb

## 2018-08-30 DIAGNOSIS — Z1211 Encounter for screening for malignant neoplasm of colon: Secondary | ICD-10-CM

## 2018-08-30 DIAGNOSIS — G8929 Other chronic pain: Secondary | ICD-10-CM

## 2018-08-30 DIAGNOSIS — Z13220 Encounter for screening for lipoid disorders: Secondary | ICD-10-CM | POA: Diagnosis not present

## 2018-08-30 DIAGNOSIS — Z1329 Encounter for screening for other suspected endocrine disorder: Secondary | ICD-10-CM | POA: Diagnosis not present

## 2018-08-30 DIAGNOSIS — Z0000 Encounter for general adult medical examination without abnormal findings: Secondary | ICD-10-CM

## 2018-08-30 DIAGNOSIS — Z130 Encounter for screening for diseases of the blood and blood-forming organs and certain disorders involving the immune mechanism: Secondary | ICD-10-CM

## 2018-08-30 DIAGNOSIS — M545 Low back pain: Secondary | ICD-10-CM

## 2018-08-30 DIAGNOSIS — Z13228 Encounter for screening for other metabolic disorders: Secondary | ICD-10-CM

## 2018-08-30 MED ORDER — MELOXICAM 15 MG PO TABS: 15.0000 mg | ORAL_TABLET | Freq: Every day | ORAL | 1 refills | Status: DC

## 2018-08-30 MED ORDER — CYCLOBENZAPRINE HCL 10 MG PO TABS: 10.0000 mg | ORAL_TABLET | Freq: Three times a day (TID) | ORAL | 0 refills | Status: DC | PRN

## 2018-08-30 NOTE — Progress Notes (Signed)
Michael Ryan 52 y.o.   Chief Complaint  Patient presents with  . Annual Exam    HISTORY OF PRESENT ILLNESS: This is a 52 y.o. male here for annual exam.  Has no complaints or medical concerns. Denies chronic medical problems. Healthy lifestyle.  Non-smoker. Traveling to [place] next month.  HPI   Prior to Admission medications   Medication Sig Start Date End Date Taking? Authorizing Provider  esomeprazole (NEXIUM) 40 MG capsule Take 1 capsule (40 mg total) by mouth every morning. 06/13/15  Yes Tereasa Coop, PA-C  cyclobenzaprine (FLEXERIL) 10 MG tablet Take 1 tablet (10 mg total) 3 (three) times daily as needed by mouth for muscle spasms. Patient not taking: Reported on 03/02/2018 08/18/17   Harrison Mons, PA  meloxicam (MOBIC) 15 MG tablet Take 1 tablet (15 mg total) daily by mouth. Patient not taking: Reported on 03/02/2018 08/18/17   Harrison Mons, PA  ranitidine (ZANTAC) 150 MG tablet Take 1 tablet (150 mg total) by mouth 2 (two) times daily. Patient not taking: Reported on 08/18/2017 05/26/16   Shawnee Knapp, MD  sucralfate (CARAFATE) 1 g tablet Take 1 tablet (1 g total) by mouth 4 (four) times daily -  with meals and at bedtime. Patient not taking: Reported on 08/18/2017 05/26/16   Shawnee Knapp, MD    No Known Allergies  Patient Active Problem List   Diagnosis Date Noted  . Chest pain 03/02/2018  . Costochondritis 03/02/2018  . GERD without esophagitis 08/18/2017  . 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 Degenerative joint disease of cervical spine 08/18/2017    Past Medical History:  Diagnosis Date  . Back pain   . GERD (gastroesophageal reflux disease)     No past surgical history on file.  Social History   Socioeconomic History  . Marital status: Divorced    Spouse name: Not on file  . Number of children: Not on file  . Years of education: Not on file  . Highest education level: Not on file  Occupational History  . Not  on file  Social Needs  . Financial resource strain: Not on file  . Food insecurity:    Worry: Not on file    Inability: Not on file  . Transportation needs:    Medical: Not on file    Non-medical: Not on file  Tobacco Use  . Smoking status: Never Smoker  . Smokeless tobacco: Never Used  Substance and Sexual Activity  . Alcohol use: No  . Drug use: No  . Sexual activity: Not on file  Lifestyle  . Physical activity:    Days per week: Not on file    Minutes per session: Not on file  . Stress: Not on file  Relationships  . Social connections:    Talks on phone: Not on file    Gets together: Not on file    Attends religious service: Not on file    Active member of club or organization: Not on file    Attends meetings of clubs or organizations: Not on file    Relationship status: Not on file  . Intimate partner violence:    Fear of current or ex partner: Not on file    Emotionally abused: Not on file    Physically abused: Not on file    Forced sexual activity: Not on file  Other Topics Concern  . Not on file  Social History Narrative  . Not on file  No family history on file.   Review of Systems  Constitutional: Negative.  Negative for chills and fever.  HENT: Negative.  Negative for congestion, nosebleeds and sore throat.   Eyes: Negative.  Negative for blurred vision and double vision.  Respiratory: Negative.  Negative for cough, hemoptysis, shortness of breath and wheezing.   Cardiovascular: Negative.  Negative for chest pain and palpitations.  Gastrointestinal: Negative.  Negative for abdominal pain, diarrhea, heartburn, nausea and vomiting.  Genitourinary: Negative.  Negative for dysuria and hematuria.  Musculoskeletal: Positive for back pain (Chronic and intermittent).  Skin: Negative.  Negative for rash.  Neurological: Negative.  Negative for dizziness and headaches.  All other systems reviewed and are negative.  Vitals:   08/30/18 1615  BP: 115/76    Pulse: 74  Resp: 16  Temp: 98.4 F (36.9 C)  SpO2: 99%     Physical Exam  Constitutional: He is oriented to person, place, and time. He appears well-developed and well-nourished.  HENT:  Head: Normocephalic and atraumatic.  Nose: Nose normal.  Mouth/Throat: Oropharynx is clear and moist.  Eyes: Pupils are equal, round, and reactive to light. Conjunctivae and EOM are normal.  Neck: Normal range of motion. Neck supple.  Cardiovascular: Normal rate, regular rhythm and normal heart sounds.  Pulmonary/Chest: Effort normal and breath sounds normal.  Abdominal: Soft. Bowel sounds are normal. He exhibits no distension and no mass. There is no tenderness.  Musculoskeletal: Normal range of motion.  Neurological: He is alert and oriented to person, place, and time. No sensory deficit. He exhibits normal muscle tone.  Skin: Skin is warm and dry. Capillary refill takes less than 2 seconds.  Psychiatric: He has a normal mood and affect. His behavior is normal.  Vitals reviewed.    ASSESSMENT & PLAN: [name] was seen today for annual exam and medication refill.  Diagnoses and all orders for this visit:  Routine general medical examination at a health care facility  Colon cancer screening -     Ambulatory referral to Gastroenterology  Screening for lipoid disorders -     Lipid panel  Screening for endocrine, metabolic and immunity disorder -     CBC -     Comprehensive metabolic panel -     Lipid panel -     TSH -     Hemoglobin A1c  Chronic bilateral low back pain without sciatica -     cyclobenzaprine (FLEXERIL) 10 MG tablet; Take 1 tablet (10 mg total) by mouth 3 (three) times daily as needed for muscle spasms. -     meloxicam (MOBIC) 15 MG tablet; Take 1 tablet (15 mg total) by mouth daily.    Patient Instructions       If you have lab work done today you will be contacted with your lab results within the next 2 weeks.  If you have not heard from [place] then please  contact us. The fastest way to get your results is to register for My Chart.   IF you received an x-ray today, you will receive an invoice from [facility]. Please contact [facility] at [phone] with questions or concerns regarding your invoice.   IF you received labwork today, you will receive an invoice from [place]. Please contact [facility] at [phone] with questions or concerns regarding your invoice.   Our billing staff will not be able to assist you with questions regarding bills from these companies.  You will be contacted with the lab results as soon as they  are available. The fastest way to get your results is to activate your My Chart account. Instructions are located on the last page of this paperwork. If you have not heard from [place] regarding the results in 2 weeks, please contact this office.       Health Maintenance, Male A healthy lifestyle and preventive care is important for your health and wellness. Ask your health care provider about what schedule of regular examinations is right for you. What should I know about weight and diet? Eat a Healthy Diet  Eat plenty of vegetables, fruits, whole grains, low-fat dairy products, and lean protein.  Do not eat a lot of foods high in solid fats, added sugars, or salt.  Maintain a Healthy Weight Regular exercise can help you achieve or maintain a healthy weight. You should:  Do at least 150 minutes of exercise each week. The exercise should increase your heart rate and make you sweat (moderate-intensity exercise).  Do strength-training exercises at least twice a week.  Watch Your Levels of Cholesterol and Blood Lipids  Have your blood tested for lipids and cholesterol every 5 years starting at 50 years of age. If you are at high risk for heart disease, you should start having your blood tested when you are 50 years old. You may need to have your cholesterol levels checked more often if: ? Your lipid or  cholesterol levels are high. ? You are older than 50 years of age. ? You are at high risk for heart disease.  What should I know about cancer screening? Many types of cancers can be detected early and may often be prevented. Lung Cancer  You should be screened every year for lung cancer if: ? You are a current smoker who has smoked for at least 30 years. ? You are a former smoker who has quit within the past 15 years.  Talk to your health care provider about your screening options, when you should start screening, and how often you should be screened.  Colorectal Cancer  Routine colorectal cancer screening usually begins at 50 years of age and should be repeated every 5-10 years until you are 50 years old. You may need to be screened more often if early forms of precancerous polyps or small growths are found. Your health care provider may recommend screening at an earlier age if you have risk factors for colon cancer.  Your health care provider may recommend using home test kits to check for hidden blood in the stool.  A small camera at the end of a tube can be used to examine your colon (sigmoidoscopy or colonoscopy). This checks for the earliest forms of colorectal cancer.  Prostate and Testicular Cancer  Depending on your age and overall health, your health care provider may do certain tests to screen for prostate and testicular cancer.  Talk to your health care provider about any symptoms or concerns you have about testicular or prostate cancer.  Skin Cancer  Check your skin from head to toe regularly.  Tell your health care provider about any new moles or changes in moles, especially if: ? There is a change in a mole's size, shape, or color. ? You have a mole that is larger than a pencil eraser.  Always use sunscreen. Apply sunscreen liberally and repeat throughout the day.  Protect yourself by wearing long sleeves, pants, a wide-brimmed hat, and sunglasses when  outside.  What should I know about heart disease, diabetes, and high blood pressure?  If you are 31-41 years of age, have your blood pressure checked every 3-5 years. If you are 10 years of age or older, have your blood pressure checked every year. You should have your blood pressure measured twice-once when you are at a hospital or clinic, and once when you are not at a hospital or clinic. Record the average of the two measurements. To check your blood pressure when you are not at a hospital or clinic, you can use: ? An automated blood pressure machine at a pharmacy. ? A home blood pressure monitor.  Talk to your health care provider about your target blood pressure.  If you are between 29-58 years old, ask your health care provider if you should take aspirin to prevent heart disease.  Have regular diabetes screenings by checking your fasting blood sugar level. ? If you are at a normal weight and have a low risk for diabetes, have this test once every three years after the age of 23. ? If you are overweight and have a high risk for diabetes, consider being tested at a younger age or more often.  A one-time screening for abdominal aortic aneurysm (AAA) by ultrasound is recommended for men aged 64-75 years who are current or former smokers. What should I know about preventing infection? Hepatitis B If you have a higher risk for hepatitis B, you should be screened for this virus. Talk with your health care provider to find out if you are at risk for hepatitis B infection. Hepatitis C Blood testing is recommended for:  Everyone born from [birth year] through 1963.  Anyone with known risk factors for hepatitis C.  Sexually Transmitted Diseases (STDs)  You should be screened each year for STDs including gonorrhea and chlamydia if: ? You are sexually active and are younger than 50 years of age. ? You are older than 50 years of age and your health care provider tells you that you are at risk for this  type of infection. ? Your sexual activity has changed since you were last screened and you are at an increased risk for chlamydia or gonorrhea. Ask your health care provider if you are at risk.  Talk with your health care provider about whether you are at high risk of being infected with HIV. Your health care provider may recommend a prescription medicine to help prevent HIV infection.  What else can I do?  Schedule regular health, dental, and eye exams.  Stay current with your vaccines (immunizations).  Do not use any tobacco products, such as cigarettes, chewing tobacco, and e-cigarettes. If you need help quitting, ask your health care provider.  Limit alcohol intake to no more than 2 drinks per day. One drink equals 12 ounces of beer, 5 ounces of wine, or 1 ounces of hard liquor.  Do not use street drugs.  Do not share needles.  Ask your health care provider for help if you need support or information about quitting drugs.  Tell your health care provider if you often feel depressed.  Tell your health care provider if you have ever been abused or do not feel safe at home. This information is not intended to replace advice given to you by your health care provider. Make sure you discuss any questions you have with your health care provider. Document Released: 03/27/2008 Document Revised: 05/28/2016 Document Reviewed: 07/03/2015 Elsevier Interactive Patient Education  2018 Elsevier Inc.       Agustina Caroli, MD [facility]

## 2018-08-30 NOTE — Patient Instructions (Addendum)

## 2018-08-31 LAB — LIPID PANEL
CHOL/HDL RATIO: 3.5 ratio (ref 0.0–5.0)
Cholesterol, Total: 170 mg/dL (ref 100–199)
HDL: 48 mg/dL (ref >39)
LDL Calculated: 84 mg/dL (ref 0–99)
Triglycerides: 191 mg/dL — ABNORMAL HIGH (ref 0–149)
VLDL Cholesterol Cal: 38 mg/dL (ref 5–40)

## 2018-08-31 LAB — COMPREHENSIVE METABOLIC PANEL
A/G RATIO: 1.4 (ref 1.2–2.2)
ALT: 15 IU/L (ref 0–44)
AST: 20 IU/L (ref 0–40)
Albumin: 4.1 g/dL (ref 3.5–5.5)
Alkaline Phosphatase: 121 IU/L — ABNORMAL HIGH (ref 39–117)
BUN / CREAT RATIO: 13 (ref 9–20)
BUN: 15 mg/dL (ref 6–24)
Bilirubin Total: 0.4 mg/dL (ref 0.0–1.2)
CO2: 24 mmol/L (ref 20–29)
Calcium: 8.9 mg/dL (ref 8.7–10.2)
Chloride: 103 mmol/L (ref 96–106)
Creatinine, Ser: 1.18 mg/dL (ref 0.76–1.27)
GFR, EST AFRICAN AMERICAN: 83 mL/min/{1.73_m2} (ref >59)
GFR, EST NON AFRICAN AMERICAN: 72 mL/min/{1.73_m2} (ref >59)
GLOBULIN, TOTAL: 3 g/dL (ref 1.5–4.5)
Glucose: 89 mg/dL (ref 65–99)
POTASSIUM: 4.1 mmol/L (ref 3.5–5.2)
SODIUM: 142 mmol/L (ref 134–144)
TOTAL PROTEIN: 7.1 g/dL (ref 6.0–8.5)

## 2018-08-31 LAB — CBC
HEMATOCRIT: 40.5 % (ref 37.5–51.0)
Hemoglobin: 13.9 g/dL (ref 13.0–17.7)
MCH: 30.7 pg (ref 26.6–33.0)
MCHC: 34.3 g/dL (ref 31.5–35.7)
MCV: 89 fL (ref 79–97)
Platelets: 250 10*3/uL (ref 150–450)
RBC: 4.53 x10E6/uL (ref 4.14–5.80)
RDW: 13 % (ref 12.3–15.4)
WBC: 5.1 10*3/uL (ref 3.4–10.8)

## 2018-08-31 LAB — HEMOGLOBIN A1C
Est. average glucose Bld gHb Est-mCnc: 108 mg/dL
Hgb A1c MFr Bld: 5.4 % (ref 4.8–5.6)

## 2018-08-31 LAB — TSH: TSH: 2.07 u[IU]/mL (ref 0.450–4.500)

## 2018-09-01 ENCOUNTER — Encounter: Payer: Self-pay | Admitting: Radiology

## 2018-12-02 ENCOUNTER — Encounter: Payer: Self-pay | Admitting: Family Medicine

## 2019-01-25 IMAGING — DX DG CHEST 2V
2 series · 2 of 2 positions shown · non-contrast
Comparison: Chest radiograph March 11, 2014 and chest CT March 11, 2014

CLINICAL DATA: Chest pain

EXAM:
CHEST - 2 VIEW

[chest pa]
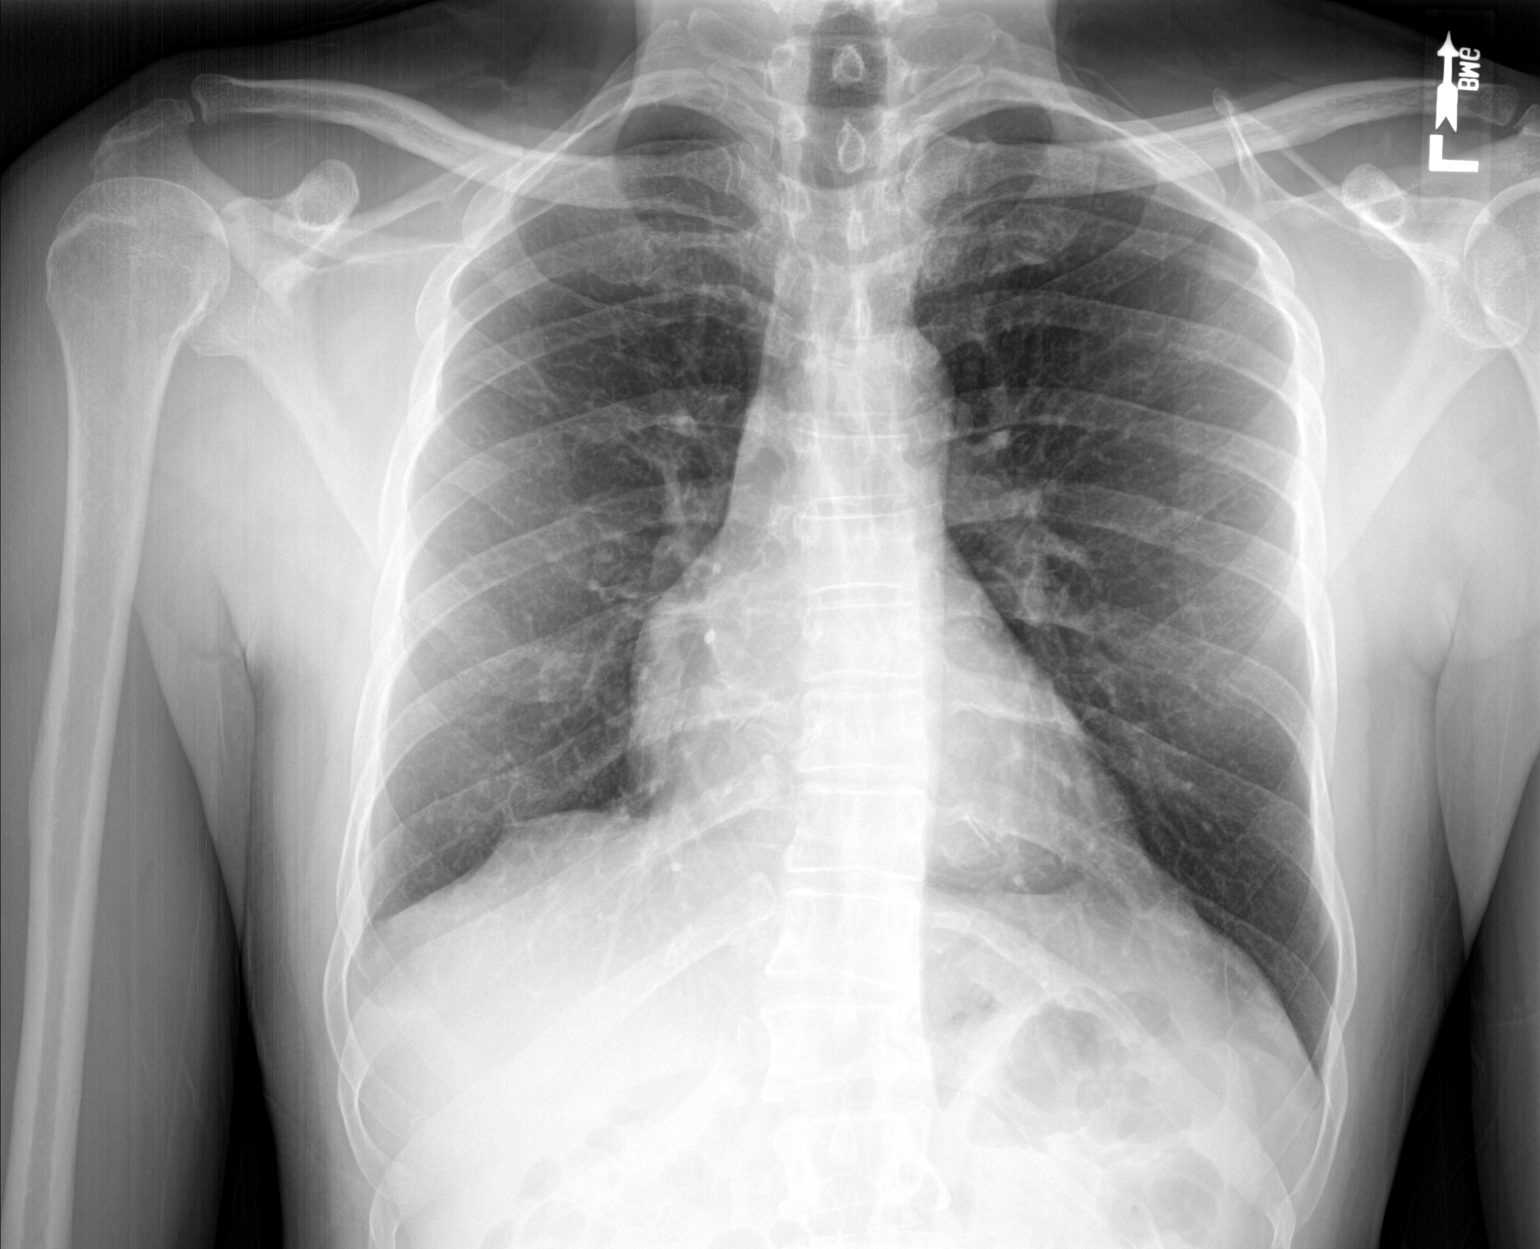

[chest lat]
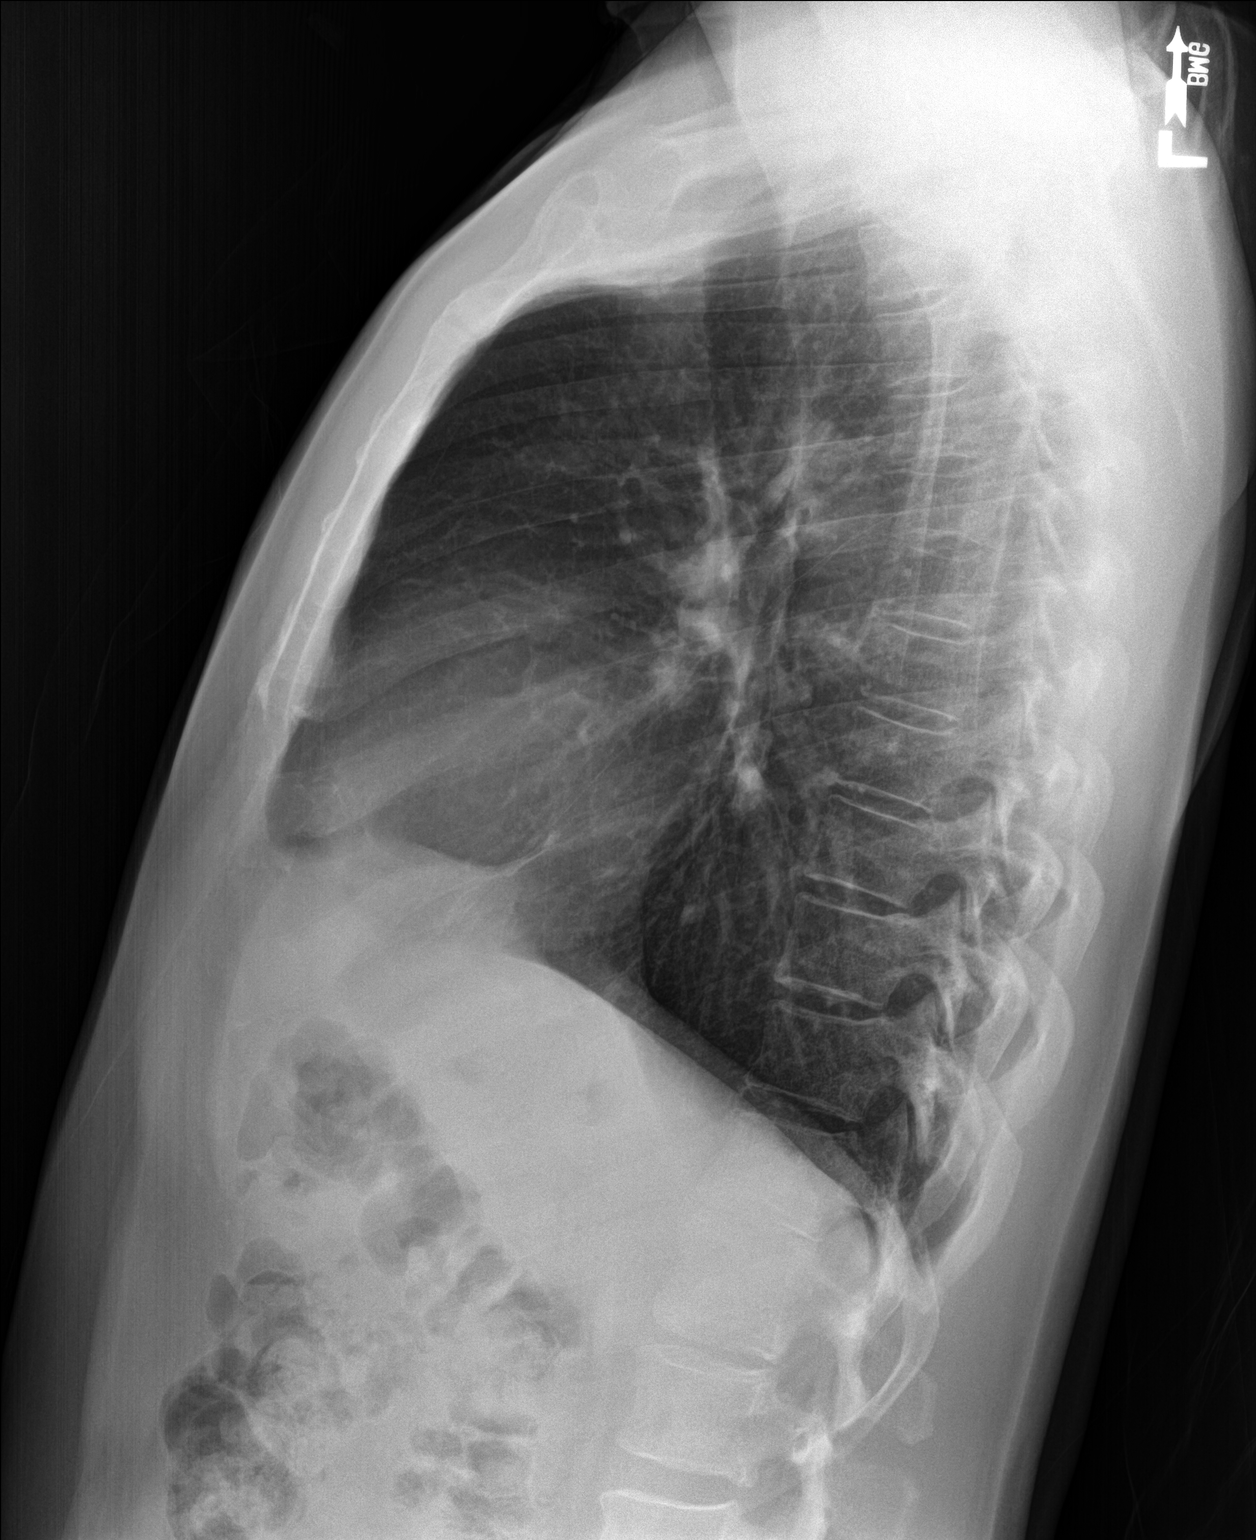

[2 of 2 positions shown; findings below may reference images not displayed]

FINDINGS: There is no edema or consolidation. The heart size and pulmonary
vascularity are normal. No adenopathy. No pneumothorax. No bone
lesions.
IMPRESSION: No edema or consolidation.

## 2020-04-02 DIAGNOSIS — E781 Pure hyperglyceridemia: Secondary | ICD-10-CM | POA: Insufficient documentation

## 2020-04-27 DIAGNOSIS — M545 Low back pain, unspecified: Secondary | ICD-10-CM | POA: Insufficient documentation

## 2020-04-27 DIAGNOSIS — M62838 Other muscle spasm: Secondary | ICD-10-CM | POA: Insufficient documentation

## 2020-05-21 DIAGNOSIS — R748 Abnormal levels of other serum enzymes: Secondary | ICD-10-CM | POA: Insufficient documentation

## 2020-05-21 DIAGNOSIS — N182 Chronic kidney disease, stage 2 (mild): Secondary | ICD-10-CM | POA: Insufficient documentation

## 2020-05-21 DIAGNOSIS — R739 Hyperglycemia, unspecified: Secondary | ICD-10-CM | POA: Insufficient documentation

## 2020-06-29 ENCOUNTER — Encounter: Payer: Self-pay | Admitting: Gastroenterology

## 2020-08-01 ENCOUNTER — Other Ambulatory Visit: Payer: Self-pay

## 2020-08-01 ENCOUNTER — Ambulatory Visit (AMBULATORY_SURGERY_CENTER): Payer: Self-pay | Admitting: *Deleted

## 2020-08-01 VITALS — Ht 70.0 in | Wt 163.0 lb

## 2020-08-01 DIAGNOSIS — Z1211 Encounter for screening for malignant neoplasm of colon: Secondary | ICD-10-CM

## 2020-08-01 MED ORDER — SUTAB 1479-225-188 MG PO TABS: 1.0000 | ORAL_TABLET | Freq: Once | ORAL | 0 refills | Status: AC

## 2020-08-01 NOTE — Progress Notes (Signed)
No egg or soy allergy known to patient  No issues with past sedation with any surgeries or procedures no intubation problems in the past  No FH of Malignant Hyperthermia No diet pills per patient No home 02 use per patient  No blood thinners per patient  Pt denies issues with constipation  No A fib or A flutter  EMMI video to pt or via [place] 19 guidelines implemented in PV today with Pt and RN   [place] Coupon given to pt in PV today , Code to Pharmacy   Due to the COVID-19 pandemic we are asking patients to follow these guidelines. Please only bring one care partner. Please be aware that your care partner may wait in the car in the parking lot or if they feel like they will be too hot to wait in the car, they may wait in the lobby on the 4th floor. All care partners are required to wear a mask the entire time (we do not have any that we can provide them), they need to practice social distancing, and we will do a Covid check for all patient's and care partners when you arrive. Also we will check their temperature and your temperature. If the care partner waits in their car they need to stay in the parking lot the entire time and we will call them on their cell phone when the patient is ready for discharge so they can bring the car to the front of the building. Also all patient's will need to wear a mask into building.

## 2020-08-15 ENCOUNTER — Ambulatory Visit (AMBULATORY_SURGERY_CENTER): Payer: 59 | Admitting: Gastroenterology

## 2020-08-15 ENCOUNTER — Other Ambulatory Visit: Payer: Self-pay

## 2020-08-15 ENCOUNTER — Encounter: Payer: Self-pay | Admitting: Gastroenterology

## 2020-08-15 VITALS — BP 126/77 | HR 56 | Temp 97.5°F | Resp 17 | Ht 70.0 in | Wt 163.0 lb

## 2020-08-15 DIAGNOSIS — D122 Benign neoplasm of ascending colon: Secondary | ICD-10-CM | POA: Diagnosis not present

## 2020-08-15 DIAGNOSIS — Z1211 Encounter for screening for malignant neoplasm of colon: Secondary | ICD-10-CM | POA: Diagnosis present

## 2020-08-15 MED ORDER — SODIUM CHLORIDE 0.9 % IV SOLN: 500.0000 mL | Freq: Once | INTRAVENOUS | Status: AC

## 2020-08-15 NOTE — Patient Instructions (Signed)
[facility]yp [facility] Divertic[facility] handout given. Await pathology from Dr Tarri Glenn.   YOU HAD AN ENDOSCOPIC PROCEDURE TODAY AT [facility]:   Refer to the procedure report that was given to you for any specific questions about what was found during the examination.  If the procedure report does not answer your questions, please call your gastroenterologist to clarify.  If you requested that your care partner not be given the details of your procedure findings, then the procedure report has been included in a sealed envelope for you to review at your convenience later.  YOU SHOULD EXPECT: Some feelings of bloating in the abdomen. Passage of more gas than usual.  Walking can help get rid of the air that was put into your GI tract during the procedure and reduce the bloating. If you had a lower endoscopy (such as a colonoscopy or flexible sigmoidoscopy) you may notice spotting of blood in your stool or on the toilet paper. If you underwent a bowel prep for your procedure, you may not have a normal bowel movement for a few days.  Please Note:  You might notice some irritation and congestion in your nose or some drainage.  This is from the oxygen used during your procedure.  There is no need for concern and it should clear up in a day or so.  SYMPTOMS TO REPORT IMMEDIATELY:   Following lower endoscopy (colonoscopy or flexible sigmoidoscopy):  Excessive amounts of blood in the stool  Significant tenderness or worsening of abdominal pains  Swelling of the abdomen that is new, acute  Fever of 100F or higher   For urgent or emergent issues, a gastroenterologist can be reached at any hour by calling [phone]. Do not use MyChart messaging for urgent concerns.    DIET:  We do recommend a small meal at first, but then you may proceed to your regular diet.  Drink plenty of fluids but you should avoid alcoholic beverages for 24 hours.  ACTIVITY:  You should plan to take it easy for the rest of  today and you should NOT DRIVE or use heavy machinery until tomorrow (because of the sedation medicines used during the test).    FOLLOW UP: Our staff will call the number listed on your records 48-72 hours following your procedure to check on you and address any questions or concerns that you may have regarding the information given to you following your procedure. If we do not reach you, we will leave a message.  We will attempt to reach you two times.  During this call, we will ask if you have developed any symptoms of COVID 19. If you develop any symptoms (ie: fever, flu-like symptoms, shortness of breath, cough etc.) before then, please call [phone].  If you test positive for Covid 19 in the 2 weeks post procedure, please call and report this information to [place].    If any biopsies were taken you will be contacted by phone or by letter within the next 1-3 weeks.  Please call us at [phone] if you have not heard about the biopsies in 3 weeks.    SIGNATURES/CONFIDENTIALITY: You and/or your care partner have signed paperwork which will be entered into your electronic medical record.  These signatures attest to the fact that that the information above on your After Visit Summary has been reviewed and is understood.  Full responsibility of the confidentiality of this discharge information lies with you and/or your care-partner.

## 2020-08-15 NOTE — Progress Notes (Signed)
PT taken to PACU. Monitors in place. VSS. Report given to RN. 

## 2020-08-15 NOTE — Progress Notes (Signed)
Called to room to assist during endoscopic procedure.  Patient ID and intended procedure confirmed with present staff. Received instructions for my participation in the procedure from the performing physician.  

## 2020-08-15 NOTE — Op Note (Addendum)
[place] Patient Name: Michael Ryan Procedure Date: 08/15/2020 10:58 AM MRN: [id number] Endoscopist: Thornton Park MD, MD Age: 54 Referring MD:  Date of Birth: 04/03/1966 Gender: Male Account #: [id number] Procedure:                Colonoscopy Indications:              Screening for colorectal malignant neoplasm, This                            is the patient's first colonoscopy                           No known family history of colon cancer or polyps                           No baseline GI symptoms Medicines:                Monitored Anesthesia Care Procedure:                Pre-Anesthesia Assessment:                           - Prior to the procedure, a History and Physical                            was performed, and patient medications and                            allergies were reviewed. The patient's tolerance of                            previous anesthesia was also reviewed. The risks                            and benefits of the procedure and the sedation                            options and risks were discussed with the patient.                            All questions were answered, and informed consent                            was obtained. Prior Anticoagulants: The patient has                            taken no previous anticoagulant or antiplatelet                            agents. ASA Grade Assessment: II - A patient with                            mild systemic disease. After reviewing the risks  and benefits, the patient was deemed in                            satisfactory condition to undergo the procedure.                           After obtaining informed consent, the colonoscope                            was passed under direct vision. Throughout the                            procedure, the patient's blood pressure, pulse, and                            oxygen saturations were monitored continuously. The                             Colonoscope was introduced through the anus and                            advanced to the 3 cm into the ileum. A second                            forward view of the right colon was performed. The                            colonoscopy was performed without difficulty. The                            patient tolerated the procedure well. The quality                            of the bowel preparation was excellent. The                            terminal ileum, ileocecal valve, appendiceal                            orifice, and rectum were photographed. Scope In: 11:06:16 AM Scope Out: 11:18:28 AM Scope Withdrawal Time: 0 hours 10 minutes 26 seconds  Total Procedure Duration: 0 hours 12 minutes 12 seconds  Findings:                 The perianal and digital rectal examinations were                            normal.                           A less than 1 mm polyp was found in the ascending                            colon. The polyp was flat. The polyp was removed  with a cold biopsy forceps. Resection and retrieval                            were complete. Estimated blood loss was minimal.                           A few small-mouthed diverticula were found in the                            sigmoid colon.                           The exam was otherwise without abnormality on                            direct and retroflexion views. Complications:            No immediate complications. Estimated blood loss:                            Minimal. Estimated Blood Loss:     Estimated blood loss was minimal. Impression:               - One less than 1 mm polyp in the ascending colon,                            removed with a cold biopsy forceps. Resected and                            retrieved.                           - The examination was otherwise normal on direct                            and retroflexion views. Recommendation:            - Patient has a contact number available for                            emergencies. The signs and symptoms of potential                            delayed complications were discussed with the                            patient. Return to normal activities tomorrow.                            Written discharge instructions were provided to the                            patient.                           - Resume previous diet. High fiber diet recommended.                           -  Continue present medications.                           - Await pathology results.                           - Repeat colonoscopy date to be determined after                            pending pathology results are reviewed for                            surveillance.                           - Emerging evidence supports eating a diet of                            fruits, vegetables, grains, calcium, and yogurt                            while reducing red meat and alcohol may reduce the                            risk of colon cancer.                           - Thank you for allowing me to be involved in your                            colon cancer prevention. Thornton Park MD, MD 08/15/2020 11:23:22 AM This report has been signed electronically.

## 2020-08-17 ENCOUNTER — Telehealth: Payer: Self-pay | Admitting: *Deleted

## 2020-08-17 NOTE — Telephone Encounter (Signed)
  Follow up Call-  Call back number 08/15/2020  Post procedure Call Back phone  # [phone] girlfriend  Permission to leave phone message Yes  Some recent data might be hidden     Patient questions:  Do you have a fever, pain , or abdominal swelling? No. Pain Score  0 *  Have you tolerated food without any problems? Yes.    Have you been able to return to your normal activities? Yes.    Do you have any questions about your discharge instructions: Diet   No. Medications  No. Follow up visit  No.  Do you have questions or concerns about your Care? No.  Actions: * If pain score is 4 or above: No action needed, pain <4.  1. Have you developed a fever since your procedure? no  2.   Have you had an respiratory symptoms (SOB or cough) since your procedure? no  3.   Have you tested positive for COVID 19 since your procedure no  4.   Have you had any family members/close contacts diagnosed with the COVID 19 since your procedure?  no   If yes to any of these questions please route to Joylene John, RN and Joella Prince, RN

## 2020-08-21 ENCOUNTER — Encounter: Payer: Self-pay | Admitting: Gastroenterology

## 2021-01-04 DIAGNOSIS — M25561 Pain in right knee: Secondary | ICD-10-CM | POA: Insufficient documentation

## 2021-01-04 DIAGNOSIS — M25562 Pain in left knee: Secondary | ICD-10-CM | POA: Insufficient documentation

## 2021-01-04 DIAGNOSIS — M25569 Pain in unspecified knee: Secondary | ICD-10-CM | POA: Insufficient documentation

## 2021-08-23 ENCOUNTER — Other Ambulatory Visit: Payer: Self-pay

## 2021-08-23 ENCOUNTER — Ambulatory Visit (INDEPENDENT_AMBULATORY_CARE_PROVIDER_SITE_OTHER): Payer: 59 | Admitting: Family Medicine

## 2021-08-23 ENCOUNTER — Ambulatory Visit: Payer: 59 | Admitting: Family Medicine

## 2021-08-23 ENCOUNTER — Encounter: Payer: Self-pay | Admitting: Family Medicine

## 2021-08-23 VITALS — BP 116/72 | HR 83 | Temp 98.4°F | Ht 69.0 in | Wt 161.0 lb

## 2021-08-23 DIAGNOSIS — M25562 Pain in left knee: Secondary | ICD-10-CM | POA: Diagnosis not present

## 2021-08-23 DIAGNOSIS — K219 Gastro-esophageal reflux disease without esophagitis: Secondary | ICD-10-CM | POA: Diagnosis not present

## 2021-08-23 DIAGNOSIS — M25561 Pain in right knee: Secondary | ICD-10-CM | POA: Diagnosis not present

## 2021-08-23 DIAGNOSIS — M545 Low back pain, unspecified: Secondary | ICD-10-CM

## 2021-08-23 MED ORDER — DICLOFENAC SODIUM 1 % EX GEL: CUTANEOUS | 0 refills | Status: AC

## 2021-08-23 NOTE — Progress Notes (Signed)
New Patient Office Visit  Subjective:  Patient ID: Michael Ryan, male    DOB: 1966/05/29  Age: 55 y.o. MRN: [id number]  CC:  Chief Complaint  Patient presents with   Establish Care    NP/establish care concerns about acid reflux would like referral to see GI. C/[place] and back pains.     HPI Michael Ryan presents for establishment of care to discuss some problems he has been having.  He has ongoing intermittent bilateral knee pain.  He reports ongoing lower back pain that seems to be worse on the right.  It is nonradiating.  He has experienced no change in his bowel or bladder function.  There is no weakness numbness or tingling.  He denies injuries to his knees or backs.  The knees do not swell.  Denies instability in his knees.  He stands for 10 hours at his job and works 40 hours each week.  He has been on his job for 20 years.  He does have an antishock mat at his station.  He wears steel toed boots.  He has seen an orthopedist for these problems recently and it was suggested that he may want to look for a different job.  Ongoing issues with reflux.  There is a burning up into his chest when it.  Denies increased burping but does admit some increase in flatulence.  It is responded to Nexium in the past.  He ran out of that medication and has been taking Prilosec without much relief.  Normal colonoscopy a few years ago.  Denies weight loss or night sweats.  There is been no blood in his stool  Past Medical History:  Diagnosis Date   Back pain    GERD (gastroesophageal reflux disease)     History reviewed. No pertinent surgical history.  Family History  Problem Relation Age of Onset   Healthy Mother    Healthy Father    Colon cancer Neg Hx    Stomach cancer Neg Hx    Esophageal cancer Neg Hx     Social History   Socioeconomic History   Marital status: Divorced    Spouse name: Not on file   Number of children: Not on file   Years of education: Not on file   Highest  education level: Not on file  Occupational History   Not on file  Tobacco Use   Smoking status: Never   Smokeless tobacco: Never  Vaping Use   Vaping Use: Never used  Substance and Sexual Activity   Alcohol use: No   Drug use: No   Sexual activity: Yes  Other Topics Concern   Not on file  Social History Narrative   Not on file   Social Determinants of Health   Financial Resource Strain: Not on file  Food Insecurity: Not on file  Transportation Needs: Not on file  Physical Activity: Not on file  Stress: Not on file  Social Connections: Not on file  Intimate Partner Violence: Not on file    ROS Review of Systems  Constitutional:  Negative for diaphoresis, fatigue, fever and unexpected weight change.  HENT: Negative.    Eyes:  Negative for photophobia and visual disturbance.  Respiratory: Negative.    Cardiovascular: Negative.   Gastrointestinal:  Negative for abdominal pain, anal bleeding and blood in stool.  Endocrine: Negative for polyphagia and polyuria.  Genitourinary:  Negative for difficulty urinating, frequency and urgency.  Musculoskeletal:  Positive for arthralgias and back pain. Negative for gait  problem and joint swelling.  Neurological:  Negative for speech difficulty and weakness.  Psychiatric/Behavioral: Negative.     Objective:   Today's Vitals: BP 116/72 (BP Location: Right Arm, Patient Position: Sitting, Cuff Size: Normal)   Pulse 83   Temp 98.4 F (36.9 C) (Temporal)   Ht 5\' 9"  (1.753 m)   Wt 161 lb (73 kg)   SpO2 97%   BMI 23.78 kg/m   Physical Exam Vitals and nursing note reviewed.  Constitutional:      General: He is not in acute distress.    Appearance: Normal appearance. He is normal weight. He is not ill-appearing, toxic-appearing or diaphoretic.  HENT:     Head: Normocephalic and atraumatic.     Right Ear: External ear normal.     Left Ear: External ear normal.  Eyes:     General: No scleral icterus.       Right eye: No  discharge.        Left eye: No discharge.     Extraocular Movements: Extraocular movements intact.     Conjunctiva/sclera: Conjunctivae normal.  Cardiovascular:     Rate and Rhythm: Normal rate and regular rhythm.  Pulmonary:     Effort: Pulmonary effort is normal.     Breath sounds: Normal breath sounds.  Abdominal:     General: Abdomen is flat. Bowel sounds are normal. There is no distension.     Palpations: Abdomen is soft. There is no mass.     Tenderness: There is no abdominal tenderness. There is no guarding or rebound.     Hernia: No hernia is present.  Musculoskeletal:     Lumbar back: No deformity, tenderness or bony tenderness. Normal range of motion. Negative right straight leg raise test and negative left straight leg raise test.     Right knee: No swelling, effusion, erythema or bony tenderness. Normal range of motion. No tenderness.     Instability Tests: Anterior drawer test negative.     Left knee: No swelling, effusion, erythema or bony tenderness. Normal range of motion. No tenderness.     Instability Tests: Anterior drawer test negative.  Skin:    General: Skin is warm and dry.  Neurological:     Mental Status: He is alert and oriented to person, place, and time.  Psychiatric:        Mood and Affect: Mood normal.        Behavior: Behavior normal.    Assessment & Plan:   Problem List Items Addressed This Visit       Digestive   GERD without esophagitis - Primary   Relevant Medications   omeprazole (PRILOSEC) 10 MG capsule   Other Relevant Orders   Amylase   CBC   Comprehensive metabolic panel   H. pylori breath test   Other Visit Diagnoses     Pain in both knees, unspecified chronicity       Relevant Medications   diclofenac Sodium (VOLTAREN) 1 % GEL   Bilateral low back pain without sciatica, unspecified chronicity       Relevant Medications   diclofenac Sodium (VOLTAREN) 1 % GEL       Outpatient Encounter Medications as of 08/23/2021   Medication Sig   diclofenac Sodium (VOLTAREN) 1 % GEL Apply a small grape sized dollop on tender area up to 4 times daily as needed.   omeprazole (PRILOSEC) 10 MG capsule Take 10 mg by mouth daily.   [DISCONTINUED] cyclobenzaprine (FLEXERIL) 10 MG tablet Take 1  tablet (10 mg total) by mouth 3 (three) times daily as needed for muscle spasms. (Patient not taking: Reported on 08/15/2020)   [DISCONTINUED] esomeprazole (NEXIUM) 40 MG capsule Take 1 capsule (40 mg total) by mouth every morning.   [DISCONTINUED] meloxicam (MOBIC) 15 MG tablet Take 1 tablet (15 mg total) by mouth daily. (Patient not taking: No sig reported)   [DISCONTINUED] ranitidine (ZANTAC) 150 MG tablet Take 1 tablet (150 mg total) by mouth 2 (two) times daily. (Patient not taking: Reported on 08/18/2017)   [DISCONTINUED] sucralfate (CARAFATE) 1 g tablet Take 1 tablet (1 g total) by mouth 4 (four) times daily -  with meals and at bedtime. (Patient not taking: Reported on 08/18/2017)   Facility-Administered Encounter Medications as of 08/23/2021  Medication   0.9 %  sodium chloride infusion    Follow-up: Return in about 3 months (around 11/23/2021), or Return fasting for physical exam in February..  Discussed to sports medicine referral for the patient.  He is leaving next month for [place] for a prolonged visit.  He will return in February.  He will follow-up with me at that time and we will discuss that referral.  In the meantime we will be restarting him back on Nexium.  Checking H. pylori a with CMP amylase and CBC.  Believe that he is most likely that his pain is related to prolonged standing over the years.  Chart review does show relatively normal x-rays of his lower back taken a few years ago.  He also will return in February for physical exam  Libby Maw, MD

## 2021-08-27 LAB — CBC
HCT: 42.6 % (ref 38.5–50.0)
Hemoglobin: 14.6 g/dL (ref 13.2–17.1)
MCH: 30.2 pg (ref 27.0–33.0)
MCHC: 34.3 g/dL (ref 32.0–36.0)
MCV: 88.2 fL (ref 80.0–100.0)
MPV: 12.5 fL (ref 7.5–12.5)
Platelets: 243 10*3/uL (ref 140–400)
RBC: 4.83 10*6/uL (ref 4.20–5.80)
RDW: 13.1 % (ref 11.0–15.0)
WBC: 5.2 10*3/uL (ref 3.8–10.8)

## 2021-08-27 LAB — COMPREHENSIVE METABOLIC PANEL
AG Ratio: 1.2 (calc) (ref 1.0–2.5)
ALT: 18 U/L (ref 9–46)
AST: 21 U/L (ref 10–35)
Albumin: 4.1 g/dL (ref 3.6–5.1)
Alkaline phosphatase (APISO): 106 U/L (ref 35–144)
BUN: 12 mg/dL (ref 7–25)
CO2: 26 mmol/L (ref 20–32)
Calcium: 9.8 mg/dL (ref 8.6–10.3)
Chloride: 105 mmol/L (ref 98–110)
Creat: 1.04 mg/dL (ref 0.70–1.30)
Globulin: 3.3 g/dL (calc) (ref 1.9–3.7)
Glucose, Bld: 104 mg/dL — ABNORMAL HIGH (ref 65–99)
Potassium: 4.1 mmol/L (ref 3.5–5.3)
Sodium: 139 mmol/L (ref 135–146)
Total Bilirubin: 0.8 mg/dL (ref 0.2–1.2)
Total Protein: 7.4 g/dL (ref 6.1–8.1)

## 2021-08-27 LAB — AMYLASE: Amylase: 86 U/L (ref 21–101)

## 2021-08-27 LAB — H. PYLORI BREATH TEST: H. pylori Breath Test: NOT DETECTED

## 2021-12-13 ENCOUNTER — Ambulatory Visit (INDEPENDENT_AMBULATORY_CARE_PROVIDER_SITE_OTHER): Payer: 59

## 2021-12-13 ENCOUNTER — Ambulatory Visit (INDEPENDENT_AMBULATORY_CARE_PROVIDER_SITE_OTHER): Payer: 59 | Admitting: Family Medicine

## 2021-12-13 ENCOUNTER — Other Ambulatory Visit: Payer: Self-pay

## 2021-12-13 ENCOUNTER — Encounter: Payer: Self-pay | Admitting: Family Medicine

## 2021-12-13 VITALS — BP 118/70 | HR 59 | Temp 97.5°F | Ht 69.0 in | Wt 163.0 lb

## 2021-12-13 DIAGNOSIS — K219 Gastro-esophageal reflux disease without esophagitis: Secondary | ICD-10-CM | POA: Diagnosis not present

## 2021-12-13 DIAGNOSIS — M545 Low back pain, unspecified: Secondary | ICD-10-CM

## 2021-12-13 MED ORDER — METHOCARBAMOL 500 MG PO TABS
500.0000 mg | ORAL_TABLET | Freq: Three times a day (TID) | ORAL | 1 refills | Status: DC | PRN
Start: 2021-12-13 — End: 2022-08-04

## 2021-12-13 MED ORDER — MELOXICAM 7.5 MG PO TABS: 7.5000 mg | ORAL_TABLET | Freq: Every day | ORAL | 0 refills | Status: DC

## 2021-12-13 MED ORDER — ESOMEPRAZOLE MAGNESIUM 40 MG PO CPDR: 40.0000 mg | DELAYED_RELEASE_CAPSULE | Freq: Every day | ORAL | 3 refills | Status: DC

## 2021-12-13 NOTE — Progress Notes (Signed)
? ?Established Patient Office Visit ? ?Subjective:  ?Patient ID: Michael Ryan, male    DOB: 10/21/1965  Age: 56 y.o. MRN: [id number] ? ?CC:  ?Chief Complaint  ?Patient presents with  ? Follow-up  ?  3 month follow up per patient still having back pains and reflux issues.   ? ? ?HPI ?Michael Ryan presents for lower back pain.  It is bilateral and nonradiating.  It is a little worse on the right.  Denies changes in bowel or bladder function.  There is no weakness or paresthesias.  No specific injury.  He stands for long hours at work.  Nexium is helped with reflux. ? ?Past Medical History:  ?Diagnosis Date  ? Back pain   ? GERD (gastroesophageal reflux disease)   ? ? ?No past surgical history on file. ? ?Family History  ?Problem Relation Age of Onset  ? Healthy Mother   ? Healthy Father   ? Colon cancer Neg Hx   ? Stomach cancer Neg Hx   ? Esophageal cancer Neg Hx   ? ? ?Social History  ? ?Socioeconomic History  ? Marital status: Divorced  ?  Spouse name: Not on file  ? Number of children: Not on file  ? Years of education: Not on file  ? Highest education level: Not on file  ?Occupational History  ? Not on file  ?Tobacco Use  ? Smoking status: Never  ? Smokeless tobacco: Never  ?Vaping Use  ? Vaping Use: Never used  ?Substance and Sexual Activity  ? Alcohol use: No  ? Drug use: No  ? Sexual activity: Yes  ?Other Topics Concern  ? Not on file  ?Social History Narrative  ? Not on file  ? ?Social Determinants of Health  ? ?Financial Resource Strain: Not on file  ?Food Insecurity: Not on file  ?Transportation Needs: Not on file  ?Physical Activity: Not on file  ?Stress: Not on file  ?Social Connections: Not on file  ?Intimate Partner Violence: Not on file  ? ? ?Outpatient Medications Prior to Visit  ?Medication Sig Dispense Refill  ? diclofenac Sodium (VOLTAREN) 1 % GEL Apply a small grape sized dollop on tender area up to 4 times daily as needed. 350 g 0  ? omeprazole (PRILOSEC) 10 MG capsule Take 10 mg by mouth  daily. (Patient not taking: Reported on 12/13/2021)    ? ?Facility-Administered Medications Prior to Visit  ?Medication Dose Route Frequency Provider Last Rate Last Admin  ? 0.9 %  sodium chloride infusion  500 mL Intravenous Once Thornton Park, MD      ? ? ?No Known Allergies ? ?ROS ?Review of Systems  ?Constitutional:  Negative for chills, diaphoresis, fatigue, fever and unexpected weight change.  ?HENT: Negative.    ?Respiratory: Negative.    ?Cardiovascular: Negative.   ?Gastrointestinal: Negative.   ?Genitourinary: Negative.   ?Musculoskeletal:  Positive for back pain. Negative for gait problem, joint swelling and myalgias.  ?Neurological:  Negative for speech difficulty and weakness.  ?Psychiatric/Behavioral: Negative.    ? ?  ?Objective:  ?  ?Physical Exam ?Vitals and nursing note reviewed.  ?Constitutional:   ?   Appearance: Normal appearance.  ?HENT:  ?   Head: Normocephalic and atraumatic.  ?Eyes:  ?   General: No scleral icterus.    ?   Right eye: No discharge.     ?   Left eye: No discharge.  ?   Extraocular Movements: Extraocular movements intact.  ?   Conjunctiva/sclera: Conjunctivae normal.  ?  Cardiovascular:  ?   Rate and Rhythm: Normal rate and regular rhythm.  ?Pulmonary:  ?   Effort: Pulmonary effort is normal.  ?   Breath sounds: Normal breath sounds.  ?Musculoskeletal:  ?   Lumbar back: No deformity, tenderness or bony tenderness. Normal range of motion. Negative right straight leg raise test and negative left straight leg raise test.  ?Skin: ?   General: Skin is warm and dry.  ?Neurological:  ?   Mental Status: He is alert and oriented to person, place, and time.  ?   Motor: No weakness.  ?   Deep Tendon Reflexes:  ?   Reflex Scores: ?     Patellar reflexes are 2+ on the right side and 2+ on the left side. ?     Achilles reflexes are 1+ on the right side and 1+ on the left side. ?Psychiatric:     ?   Mood and Affect: Mood normal.     ?   Behavior: Behavior normal.  ? ? ?BP 118/70 (BP Location:  Right Arm, Patient Position: Sitting, Cuff Size: Normal)   Pulse (!) 5   Temp (!) 97.5 ?F (36.4 ?C) (Temporal)   Ht 5\' 9"  (1.753 m)   Wt 163 lb (73.9 kg)   SpO2 98%   BMI 24.07 kg/m?  ?Wt Readings from Last 3 Encounters:  ?12/13/21 163 lb (73.9 kg)  ?08/23/21 161 lb (73 kg)  ?08/15/20 163 lb (73.9 kg)  ? ? ? ?Health Maintenance Due  ?Topic Date Due  ? HIV Screening  Never done  ? Hepatitis C Screening  Never done  ? ? ?There are no preventive care reminders to display for this patient. ? ?Lab Results  ?Component Value Date  ? TSH 2.070 08/30/2018  ? ?Lab Results  ?Component Value Date  ? WBC 5.2 08/23/2021  ? HGB 14.6 08/23/2021  ? HCT 42.6 08/23/2021  ? MCV 88.2 08/23/2021  ? PLT 243 08/23/2021  ? ?Lab Results  ?Component Value Date  ? NA 139 08/23/2021  ? K 4.1 08/23/2021  ? CO2 26 08/23/2021  ? GLUCOSE 104 (H) 08/23/2021  ? BUN 12 08/23/2021  ? CREATININE 1.04 08/23/2021  ? BILITOT 0.8 08/23/2021  ? ALKPHOS 121 (H) 08/30/2018  ? AST 21 08/23/2021  ? ALT 18 08/23/2021  ? PROT 7.4 08/23/2021  ? ALBUMIN 4.1 08/30/2018  ? CALCIUM 9.8 08/23/2021  ? ?Lab Results  ?Component Value Date  ? CHOL 170 08/30/2018  ? ?Lab Results  ?Component Value Date  ? HDL 48 08/30/2018  ? ?Lab Results  ?Component Value Date  ? Cecilia 84 08/30/2018  ? ?Lab Results  ?Component Value Date  ? TRIG 191 (H) 08/30/2018  ? ?Lab Results  ?Component Value Date  ? CHOLHDL 3.5 08/30/2018  ? ?Lab Results  ?Component Value Date  ? HGBA1C 5.4 08/30/2018  ? ? ?  ?Assessment & Plan:  ? ?Problem List Items Addressed This Visit   ? ?  ? Digestive  ? GERD without esophagitis - Primary  ? Relevant Medications  ? esomeprazole (NEXIUM) 40 MG capsule  ?  ? Other  ? Bilateral low back pain without sciatica  ? Relevant Medications  ? meloxicam (MOBIC) 7.5 MG tablet  ? methocarbamol (ROBAXIN) 500 MG tablet  ? Other Relevant Orders  ? Ambulatory referral to Sports Medicine  ? DG Lumbar Spine Complete  ? ? ?Meds ordered this encounter  ?Medications  ?  esomeprazole (NEXIUM) 40 MG capsule  ?  Sig: Take 1 capsule (40 mg total) by mouth daily.  ?  Dispense:  30 capsule  ?  Refill:  3  ? meloxicam (MOBIC) 7.5 MG tablet  ?  Sig: Take 1 tablet (7.5 mg total) by mouth daily.  ?  Dispense:  30 tablet  ?  Refill:  0  ? methocarbamol (ROBAXIN) 500 MG tablet  ?  Sig: Take 1 tablet (500 mg total) by mouth every 8 (eight) hours as needed for muscle spasms.  ?  Dispense:  30 tablet  ?  Refill:  1  ? ? ?Follow-up: Return Please return fasting for physical exam..  ?Lumbago he will take meloxicam daily with as needed use of Robaxin.  Refer to sports medicine for ongoing management.  Continue Nexium.  Return in the next 2 or 3 months fasting for complete physical exam. ? ?Libby Maw, MD ?

## 2022-07-14 ENCOUNTER — Encounter: Payer: Self-pay | Admitting: Family Medicine

## 2022-07-14 ENCOUNTER — Ambulatory Visit (INDEPENDENT_AMBULATORY_CARE_PROVIDER_SITE_OTHER): Payer: BC Managed Care – PPO | Admitting: Family Medicine

## 2022-07-14 VITALS — BP 130/78 | HR 71 | Temp 97.1°F | Ht 69.0 in | Wt 165.2 lb

## 2022-07-14 DIAGNOSIS — Z114 Encounter for screening for human immunodeficiency virus [HIV]: Secondary | ICD-10-CM | POA: Insufficient documentation

## 2022-07-14 DIAGNOSIS — Z0000 Encounter for general adult medical examination without abnormal findings: Secondary | ICD-10-CM

## 2022-07-14 DIAGNOSIS — H93A1 Pulsatile tinnitus, right ear: Secondary | ICD-10-CM | POA: Diagnosis not present

## 2022-07-14 DIAGNOSIS — Z0001 Encounter for general adult medical examination with abnormal findings: Secondary | ICD-10-CM | POA: Diagnosis not present

## 2022-07-14 NOTE — Progress Notes (Addendum)
Established Patient Office Visit  Subjective   Patient ID: Michael Ryan, male    DOB: [birth year]  Age: 56 y.o. MRN: [id number]  Chief Complaint  Patient presents with   Annual Exam    CPE states that he is having a strange sound going on inside of  his head.    HPI for health check and evaluation of a strange sound that he has been hearing about his ears.  He actually hears his heartbeat in his ears.  He is otherwise healthy.  He does not smoke drink alcohol or use illicit drugs.  No regular exercise or dental care.    Review of Systems  Constitutional: Negative.   HENT: Negative.    Eyes:  Negative for blurred vision, discharge and redness.  Respiratory: Negative.    Cardiovascular: Negative.   Gastrointestinal:  Negative for abdominal pain.  Genitourinary: Negative.   Musculoskeletal: Negative.  Negative for myalgias.  Skin:  Negative for rash.  Neurological:  Negative for tingling, loss of consciousness and weakness.  Endo/Heme/Allergies:  Negative for polydipsia.      Objective:     BP 130/78 (BP Location: Right Arm, Patient Position: Sitting, Cuff Size: Normal)   Pulse 71   Temp (!) 97.1 F (36.2 C) (Temporal)   Ht '5\' 9"'$  (1.753 m)   Wt 165 lb 3.2 oz (74.9 kg)   SpO2 98%   BMI 24.40 kg/m    Physical Exam Constitutional:      General: He is not in acute distress.    Appearance: Normal appearance. He is not ill-appearing, toxic-appearing or diaphoretic.  HENT:     Head: Normocephalic and atraumatic.     Right Ear: External ear normal.     Left Ear: External ear normal.     Mouth/Throat:     Mouth: Mucous membranes are moist.     Pharynx: Oropharynx is clear. No oropharyngeal exudate or posterior oropharyngeal erythema.  Eyes:     General: No scleral icterus.       Right eye: No discharge.        Left eye: No discharge.     Extraocular Movements: Extraocular movements intact.     Conjunctiva/sclera: Conjunctivae normal.     Pupils: Pupils are equal,  round, and reactive to light.  Cardiovascular:     Rate and Rhythm: Normal rate and regular rhythm.  Pulmonary:     Effort: Pulmonary effort is normal. No respiratory distress.     Breath sounds: Normal breath sounds.  Abdominal:     General: Bowel sounds are normal.     Tenderness: There is no abdominal tenderness. There is no guarding.     Hernia: There is no hernia in the left inguinal area or right inguinal area.  Genitourinary:    Penis: No hypospadias, erythema, tenderness, discharge or swelling.      Testes:        Right: Mass, tenderness or swelling not present. Right testis is descended.        Left: Mass, tenderness or swelling not present. Left testis is descended.     Epididymis:     Right: Not inflamed or enlarged.     Left: Not inflamed or enlarged.     Prostate: Not enlarged, not tender and no nodules present.     Rectum: Guaiac result negative. No mass, tenderness, anal fissure, external hemorrhoid or internal hemorrhoid. Normal anal tone.  Musculoskeletal:     Cervical back: No rigidity or tenderness.  Lymphadenopathy:  Lower Body: No right inguinal adenopathy. No left inguinal adenopathy.  Skin:    General: Skin is warm and dry.  Neurological:     Mental Status: He is alert and oriented to person, place, and time.  Psychiatric:        Mood and Affect: Mood normal.        Behavior: Behavior normal.      No results found for any visits on 07/14/22.    The ASCVD Risk score (Arnett DK, et al., 2019) failed to calculate for the following reasons:   Cannot find a previous HDL lab   Cannot find a previous total cholesterol lab    Assessment & Plan:   Problem List Items Addressed This Visit       Nervous and Auditory   Pulsatile tinnitus of right ear   Relevant Orders   MR Brain W Wo Contrast     Other   Screening for HIV (human immunodeficiency virus) - Primary    Return in about 4 weeks (around 08/11/2022), or Return Wednesday for ordered  fasting labs.Marland Kitchen   MRI ordered for pulsatile tinnitus. Libby Maw, MD

## 2022-07-16 ENCOUNTER — Other Ambulatory Visit (INDEPENDENT_AMBULATORY_CARE_PROVIDER_SITE_OTHER): Payer: BC Managed Care – PPO

## 2022-07-16 DIAGNOSIS — Z0000 Encounter for general adult medical examination without abnormal findings: Secondary | ICD-10-CM

## 2022-07-16 LAB — URINALYSIS, ROUTINE W REFLEX MICROSCOPIC
Bilirubin Urine: NEGATIVE
Hgb urine dipstick: NEGATIVE
Ketones, ur: NEGATIVE
Leukocytes,Ua: NEGATIVE
Nitrite: NEGATIVE
RBC / HPF: NONE SEEN (ref 0–?)
Specific Gravity, Urine: 1.015 (ref 1.000–1.030)
Total Protein, Urine: NEGATIVE
Urine Glucose: NEGATIVE
Urobilinogen, UA: 1 (ref 0.0–1.0)
pH: 6.5 (ref 5.0–8.0)

## 2022-07-16 LAB — COMPREHENSIVE METABOLIC PANEL
ALT: 24 U/L (ref 0–53)
AST: 23 U/L (ref 0–37)
Albumin: 4.1 g/dL (ref 3.5–5.2)
Alkaline Phosphatase: 106 U/L (ref 39–117)
BUN: 13 mg/dL (ref 6–23)
CO2: 29 mEq/L (ref 19–32)
Calcium: 9.3 mg/dL (ref 8.4–10.5)
Chloride: 104 mEq/L (ref 96–112)
Creatinine, Ser: 1.02 mg/dL (ref 0.40–1.50)
GFR: 83.43 mL/min (ref >60.00)
Glucose, Bld: 86 mg/dL (ref 70–99)
Potassium: 3.9 mEq/L (ref 3.5–5.1)
Sodium: 141 mEq/L (ref 135–145)
Total Bilirubin: 1.4 mg/dL — ABNORMAL HIGH (ref 0.2–1.2)
Total Protein: 7.2 g/dL (ref 6.0–8.3)

## 2022-07-16 LAB — CBC
HCT: 43.1 % (ref 39.0–52.0)
Hemoglobin: 14.4 g/dL (ref 13.0–17.0)
MCHC: 33.3 g/dL (ref 30.0–36.0)
MCV: 91.1 fl (ref 78.0–100.0)
Platelets: 239 10*3/uL (ref 150.0–400.0)
RBC: 4.73 Mil/uL (ref 4.22–5.81)
RDW: 13.5 % (ref 11.5–15.5)
WBC: 6.1 10*3/uL (ref 4.0–10.5)

## 2022-07-16 LAB — LIPID PANEL
Cholesterol: 213 mg/dL — ABNORMAL HIGH (ref 0–200)
HDL: 54 mg/dL (ref >39.00)
LDL Cholesterol: 140 mg/dL — ABNORMAL HIGH (ref 0–99)
NonHDL: 158.91
Total CHOL/HDL Ratio: 4
Triglycerides: 97 mg/dL (ref 0.0–149.0)
VLDL: 19.4 mg/dL (ref 0.0–40.0)

## 2022-07-16 LAB — PSA: PSA: 1.24 ng/mL (ref 0.10–4.00)

## 2022-07-18 ENCOUNTER — Telehealth: Payer: Self-pay | Admitting: Family Medicine

## 2022-07-18 NOTE — Telephone Encounter (Signed)
Caller Name: Arvel Oquinn Call back phone #: [phone]  Reason for Call: Please call pt about his recent labs and MRI, he is frustrated and concerned about his health.

## 2022-07-18 NOTE — Telephone Encounter (Signed)
Spoke with patient who verbally understood MRI appointment processing waiting for approval from insurance.

## 2022-08-04 ENCOUNTER — Ambulatory Visit: Payer: BC Managed Care – PPO | Admitting: Family Medicine

## 2022-08-04 ENCOUNTER — Encounter: Payer: Self-pay | Admitting: Family Medicine

## 2022-08-04 VITALS — BP 138/72 | HR 68 | Temp 97.9°F | Ht 69.0 in | Wt 163.2 lb

## 2022-08-04 DIAGNOSIS — K219 Gastro-esophageal reflux disease without esophagitis: Secondary | ICD-10-CM

## 2022-08-04 DIAGNOSIS — H93A1 Pulsatile tinnitus, right ear: Secondary | ICD-10-CM

## 2022-08-04 DIAGNOSIS — S46811A Strain of other muscles, fascia and tendons at shoulder and upper arm level, right arm, initial encounter: Secondary | ICD-10-CM | POA: Diagnosis not present

## 2022-08-04 MED ORDER — ESOMEPRAZOLE MAGNESIUM 40 MG PO CPDR: 40.0000 mg | DELAYED_RELEASE_CAPSULE | Freq: Every day | ORAL | 1 refills | Status: AC

## 2022-08-04 MED ORDER — MELOXICAM 7.5 MG PO TABS: 7.5000 mg | ORAL_TABLET | Freq: Every day | ORAL | 0 refills | Status: DC

## 2022-08-04 NOTE — Progress Notes (Addendum)
Established Patient Office Visit  Subjective   Patient ID: Michael Ryan, male    DOB: July 14, 1966  Age: 56 y.o. MRN: [id number]  Chief Complaint  Patient presents with   Follow-up    4 week follow up, still having bad headaches.     HPI follow-up of pulsatile tinnitus.  MRI was ordered.  He has not been given a scheduled date.  He was scheduled to go to [place] to see his wife who just gave birth to their child but cannot go.  Would like a refill on the Nexium for his GERD.  It is helpful.  Right upper back pain after lifting.    Review of Systems  Constitutional: Negative.   HENT:  Positive for tinnitus. Negative for ear discharge and ear pain.   Eyes:  Negative for blurred vision, discharge and redness.  Respiratory: Negative.    Cardiovascular: Negative.   Gastrointestinal:  Positive for heartburn. Negative for abdominal pain.  Genitourinary: Negative.   Musculoskeletal:  Positive for back pain and myalgias.  Skin:  Negative for rash.  Neurological:  Negative for tingling, loss of consciousness and weakness.  Endo/Heme/Allergies:  Negative for polydipsia.      Objective:     BP 138/72 (BP Location: Left Arm, Patient Position: Sitting, Cuff Size: Normal)   Pulse 68   Temp 97.9 F (36.6 C) (Temporal)   Ht '5\' 9"'$  (1.753 m)   Wt 163 lb 3.2 oz (74 kg)   SpO2 98%   BMI 24.10 kg/m    Physical Exam Constitutional:      General: He is not in acute distress.    Appearance: Normal appearance. He is not ill-appearing, toxic-appearing or diaphoretic.  HENT:     Head: Normocephalic and atraumatic.     Right Ear: External ear normal.     Left Ear: External ear normal.  Eyes:     General: No scleral icterus.       Right eye: No discharge.        Left eye: No discharge.     Extraocular Movements: Extraocular movements intact.     Conjunctiva/sclera: Conjunctivae normal.     Pupils: Pupils are equal, round, and reactive to light.  Cardiovascular:     Rate and Rhythm:  Normal rate and regular rhythm.  Pulmonary:     Effort: Pulmonary effort is normal. No respiratory distress.     Breath sounds: Normal breath sounds.  Abdominal:     General: Bowel sounds are normal.     Tenderness: There is no abdominal tenderness. There is no guarding.  Musculoskeletal:     Right shoulder: Normal. No tenderness or bony tenderness. Normal range of motion.     Left shoulder: No tenderness or bony tenderness. Normal range of motion.       Arms:     Cervical back: No rigidity or tenderness.     Thoracic back: Tenderness present. No bony tenderness. Normal range of motion.  Skin:    General: Skin is warm and dry.  Neurological:     Mental Status: He is alert and oriented to person, place, and time.  Psychiatric:        Mood and Affect: Mood normal.        Behavior: Behavior normal.      No results found for any visits on 08/04/22.    The 10-year ASCVD risk score (Arnett DK, et al., 2019) is: 7.3%    Assessment & Plan:   Problem List Items Addressed  This Visit       Digestive   GERD without esophagitis   Relevant Medications   esomeprazole (NEXIUM) 40 MG capsule     Nervous and Auditory   Pulsatile tinnitus of right ear - Primary   Relevant Orders   MR Brain W Wo Contrast   Ambulatory referral to ENT     Musculoskeletal and Integument   Trapezius strain, right, initial encounter   Relevant Medications   meloxicam (MOBIC) 7.5 MG tablet    Return in about 6 weeks (around 09/15/2022).   We ordered MRI of brain secondary to pulsatile tinnitus.  We start Nexium.  Meloxicam for trapezius strain. Libby Maw, MD

## 2022-08-05 ENCOUNTER — Ambulatory Visit (HOSPITAL_COMMUNITY)
Admission: RE | Admit: 2022-08-05 | Discharge: 2022-08-05 | Disposition: A | Discharge status: Home / Self Care | Payer: BC Managed Care – PPO | Source: Ambulatory Visit | Attending: Family Medicine | Admitting: Family Medicine

## 2022-08-05 DIAGNOSIS — H93A1 Pulsatile tinnitus, right ear: Secondary | ICD-10-CM | POA: Insufficient documentation

## 2022-08-05 DIAGNOSIS — H9311 Tinnitus, right ear: Secondary | ICD-10-CM | POA: Diagnosis not present

## 2022-08-05 MED ORDER — GADOBUTROL 1 MMOL/ML IV SOLN
7.5000 mL | Freq: Once | INTRAVENOUS | Status: AC | PRN
  Administered 2022-08-05: 7.5 mL via INTRAVENOUS

## 2022-08-08 NOTE — Addendum Note (Signed)
Addended by: Jon Billings on: 08/08/2022 01:38 PM   Modules accepted: Orders

## 2022-08-31 ENCOUNTER — Other Ambulatory Visit: Payer: Self-pay | Admitting: Family Medicine

## 2022-08-31 DIAGNOSIS — S46811A Strain of other muscles, fascia and tendons at shoulder and upper arm level, right arm, initial encounter: Secondary | ICD-10-CM

## 2023-03-06 DIAGNOSIS — E782 Mixed hyperlipidemia: Secondary | ICD-10-CM | POA: Diagnosis not present

## 2023-03-06 DIAGNOSIS — Z23 Encounter for immunization: Secondary | ICD-10-CM | POA: Diagnosis not present

## 2023-03-06 DIAGNOSIS — R748 Abnormal levels of other serum enzymes: Secondary | ICD-10-CM | POA: Diagnosis not present

## 2023-03-06 DIAGNOSIS — N182 Chronic kidney disease, stage 2 (mild): Secondary | ICD-10-CM | POA: Diagnosis not present

## 2023-03-06 DIAGNOSIS — Z0001 Encounter for general adult medical examination with abnormal findings: Secondary | ICD-10-CM | POA: Diagnosis not present

## 2023-03-06 DIAGNOSIS — R739 Hyperglycemia, unspecified: Secondary | ICD-10-CM | POA: Diagnosis not present

## 2023-09-07 DIAGNOSIS — H9313 Tinnitus, bilateral: Secondary | ICD-10-CM | POA: Diagnosis not present

## 2023-09-07 DIAGNOSIS — K219 Gastro-esophageal reflux disease without esophagitis: Secondary | ICD-10-CM | POA: Diagnosis not present
